# Patient Record
Sex: Female | Born: 1995 | Race: White | Hispanic: Yes | Marital: Single | Smoking: Former smoker
Health system: Southern US, Community
[De-identification: ages and names within clinical notes are randomized; demographics above are authoritative.]

## PROBLEM LIST (undated history)

## (undated) DIAGNOSIS — F419 Anxiety disorder, unspecified: Secondary | ICD-10-CM

## (undated) DIAGNOSIS — Z8041 Family history of malignant neoplasm of ovary: Secondary | ICD-10-CM

## (undated) DIAGNOSIS — Z8669 Personal history of other diseases of the nervous system and sense organs: Secondary | ICD-10-CM

## (undated) DIAGNOSIS — F909 Attention-deficit hyperactivity disorder, unspecified type: Secondary | ICD-10-CM

## (undated) DIAGNOSIS — M940 Chondrocostal junction syndrome [Tietze]: Secondary | ICD-10-CM

## (undated) HISTORY — DX: Chondrocostal junction syndrome (tietze): M94.0

## (undated) HISTORY — PX: MYRINGOTOMY: SUR874

## (undated) HISTORY — DX: Personal history of other diseases of the nervous system and sense organs: Z86.69

## (undated) HISTORY — DX: Family history of malignant neoplasm of ovary: Z80.41

## (undated) HISTORY — PX: WISDOM TOOTH EXTRACTION: SHX21

## (undated) HISTORY — DX: Attention-deficit hyperactivity disorder, unspecified type: F90.9

## (undated) HISTORY — DX: Anxiety disorder, unspecified: F41.9

---
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
## 2017-03-23 ENCOUNTER — Ambulatory Visit
Admission: EM | Admit: 2017-03-23 | Discharge: 2017-03-23 | Disposition: A | Discharge status: Home / Self Care | Payer: 59 | Attending: Family Medicine | Admitting: Family Medicine

## 2017-03-23 ENCOUNTER — Encounter: Payer: Self-pay | Admitting: Emergency Medicine

## 2017-03-23 ENCOUNTER — Other Ambulatory Visit: Payer: Self-pay

## 2017-03-23 DIAGNOSIS — F410 Panic disorder [episodic paroxysmal anxiety] without agoraphobia: Secondary | ICD-10-CM

## 2017-03-23 DIAGNOSIS — F419 Anxiety disorder, unspecified: Secondary | ICD-10-CM

## 2017-03-23 MED ORDER — HYDROXYZINE HCL 25 MG PO TABS: 25.0000 mg | ORAL_TABLET | Freq: Four times a day (QID) | ORAL | 0 refills | Status: DC

## 2017-03-23 MED ORDER — ALPRAZOLAM 0.25 MG PO TABS: 0.2500 mg | ORAL_TABLET | Freq: Three times a day (TID) | ORAL | 0 refills | Status: DC | PRN

## 2017-03-23 NOTE — ED Provider Notes (Addendum)
[facility]    CSN: [id number] Arrival date & time: 03/23/17  1845     History   Chief Complaint Chief Complaint  Patient presents with  . Dizziness  . Anxiety    HPI Rachel Hebert is a 22 y.o. female.   HPI  22 year old female presents with complaints of dizziness anxiety under extreme stress both at home and financially.  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  States that she was fine at work and at home had a feeling of eyes twitching wildly and when she closed her eyes would have colors of red and green.  She also has numbness in her right dominant arm and hand maintains a full function and use.  She appears upset.  She does not have ideation of self-harm or homicide.  She does state that she has already made an appointment to see a counselor at [facility] but is not for a week.  Had no chest pain.  She has had no visual changes.  Most of the numbness that she feels is in her hand on the right but not on the left and has no feelings  of radiation at this time.  She denies any syncope or near syncope.  States she has never had a similar attack in the past.  Lives with her sister and  as well as her sister's fianc, a roommate and herself.  She has been trying to save money to move out of the house on her own but has not been able to gather enough finances to do it.  She is also had car trouble and she is applying for nursing school wanting to attend classes starting in May. These are the Stressors to her.       History reviewed. No pertinent past medical history.  There are no active problems to display for this patient.   Past Surgical History:  Procedure Laterality Date  . MYRINGOTOMY    . WISDOM TOOTH EXTRACTION      OB History    No data available       Home Medications    Prior to Admission medications   Medication Sig Start Date End Date Taking? Authorizing Provider  ALPRAZolam (XANAX) 0.25 MG tablet Take 1 tablet (0.25 mg  total) by mouth 3 (three) times daily as needed for anxiety. 03/23/17   Lutricia Feil, PA-C  hydrOXYzine (ATARAX/VISTARIL) 25 MG tablet Take 1 tablet (25 mg total) by mouth every 6 (six) hours. 03/23/17   Lutricia Feil, PA-C    Family History History reviewed. No pertinent family history.  Social History Social History   Tobacco Use  . Smoking status: Never Smoker  . Smokeless tobacco: Never Used  Substance Use Topics  . Alcohol use: No  . Drug use: No     Allergies   Patient has no known allergies.   Review of Systems Review of Systems  Constitutional: Positive for activity change. Negative for appetite change, chills, diaphoresis, fatigue and fever.  Neurological: Positive for dizziness.  Psychiatric/Behavioral: Negative for self-injury, sleep disturbance and suicidal ideas. The patient is nervous/anxious.   All other systems reviewed and are negative.    Physical Exam Triage Vital Signs ED Triage Vitals  Enc Vitals Group     BP 03/23/17 1858 125/84     Pulse Rate 03/23/17 1858 74     Resp 03/23/17 1858 14     Temp 03/23/17 1858 98.4 F (36.9 C)  Temp Source 03/23/17 1858 Oral     SpO2 03/23/17 1858 99 %     Weight 03/23/17 1855 127 lb (57.6 kg)     Height 03/23/17 1855 4\' 11"  (1.499 m)     Head Circumference --      Peak Flow --      Pain Score 03/23/17 1855 0     Pain Loc --      Pain Edu? --      Excl. in GC? --    No data found.  Updated Vital Signs BP 125/84 (BP Location: Left Arm)   Pulse 74   Temp 98.4 F (36.9 C) (Oral)   Resp 14   Ht 4\' 11"  (1.499 m)   Wt 127 lb (57.6 kg)   LMP 03/14/2017 (Approximate)   SpO2 99%   BMI 25.65 kg/m   Visual Acuity Right Eye Distance:   Left Eye Distance:   Bilateral Distance:    Right Eye Near:   Left Eye Near:    Bilateral Near:     Physical Exam  Constitutional: She is oriented to person, place, and time. She appears well-developed and well-nourished. No distress.  HENT:  Head:  Normocephalic.  Eyes: Pupils are equal, round, and reactive to light.  Neck: Normal range of motion.  Musculoskeletal: Normal range of motion.  Neurological: She is alert and oriented to person, place, and time.  Skin: Skin is warm and dry. She is not diaphoretic.  Psychiatric: She has a normal mood and affect. Her behavior is normal. Judgment and thought content normal.  Nursing note and vitals reviewed.    UC Treatments / Results  Labs (all labs ordered are listed, but only abnormal results are displayed) Labs Reviewed - No data to display  EKG  EKG Interpretation None       Radiology No results found.  Procedures Procedures (including critical care time)  Medications Ordered in UC Medications - No data to display   Initial Impression / Assessment and Plan / UC Course  I have reviewed the triage vital signs and the nursing notes.  Pertinent labs & imaging results that were available during my care of the patient were reviewed by me and considered in my medical decision making (see chart for details).     Plan: 1. Test/x-ray results and diagnosis reviewed with patient 2. rx as per orders; risks, benefits, potential side effects reviewed with patient 3. Recommend supportive treatment with to remove her appointment with a counselor up sooner.  In the meantime I have reassured her that there does not appear to be any physical problem with her that this is likely a panic attack.  We will prescribe a few medications to help with her anxiety hopes that she will be able to see the counselor sooner for intervention.  If she worsens or has any edges in her or any other physical signs I recommend she go to the emergency room where they have mental health professionals on staff. 4. F/u prn if symptoms worsen or don't improve   Final Clinical Impressions(s) / UC Diagnoses   Final diagnoses:  Panic attack  Anxiety    ED Discharge Orders        Ordered    ALPRAZolam (XANAX)  0.25 MG tablet  3 times daily PRN     03/23/17 1944    hydrOXYzine (ATARAX/VISTARIL) 25 MG tablet  Every 6 hours     03/23/17 1944       Controlled Substance Prescriptions [place]  Controlled Substance Registry consulted? Not Applicable   Lutricia Feil, PA-C 03/23/17 2001    Lutricia Feil, PA-C 03/23/17 2004

## 2017-03-23 NOTE — ED Triage Notes (Signed)
Patient states that while at home she started to feel dizzy about an hour ago and started to feel anxious.

## 2017-04-13 ENCOUNTER — Ambulatory Visit (INDEPENDENT_AMBULATORY_CARE_PROVIDER_SITE_OTHER): Payer: 59 | Admitting: Certified Nurse Midwife

## 2017-04-13 ENCOUNTER — Encounter: Payer: Self-pay | Admitting: Certified Nurse Midwife

## 2017-04-13 VITALS — BP 102/88 | HR 69 | Ht 59.0 in | Wt 131.0 lb

## 2017-04-13 DIAGNOSIS — Z131 Encounter for screening for diabetes mellitus: Secondary | ICD-10-CM

## 2017-04-13 DIAGNOSIS — Z01419 Encounter for gynecological examination (general) (routine) without abnormal findings: Secondary | ICD-10-CM | POA: Diagnosis not present

## 2017-04-13 DIAGNOSIS — Z13220 Encounter for screening for lipoid disorders: Secondary | ICD-10-CM

## 2017-04-13 DIAGNOSIS — Z113 Encounter for screening for infections with a predominantly sexual mode of transmission: Secondary | ICD-10-CM | POA: Diagnosis not present

## 2017-04-13 DIAGNOSIS — F419 Anxiety disorder, unspecified: Secondary | ICD-10-CM | POA: Insufficient documentation

## 2017-04-13 DIAGNOSIS — F410 Panic disorder [episodic paroxysmal anxiety] without agoraphobia: Secondary | ICD-10-CM | POA: Insufficient documentation

## 2017-04-13 NOTE — Progress Notes (Signed)
Gynecology Annual Exam  PCP: Patient, No Pcp Per  Chief Complaint:  Chief Complaint  Patient presents with  . Gynecologic Exam    History of Present Illness: Rachel Hebert is a 22 y.o. female who presents for a NP gyn exam. She has recently moved from [facility], [place] And works at same day surgery. The patient has no complaints today.  Her menses are regular, they occur every month, and they last 4 days. Her flow is moderate. She does not have intermenstrual bleeding. Her last menstrual period was 03/21/2017. She has dysmenorrhea and uses a heating pad with relief. Last pap smear: NA due to age.   The patient is not currently sexually active. She currently uses condoms for contraception.   Her past medical history is remarkable for anxiety and common migraines.  The patient does not know how to perform self breast exams. Her last mammogram was NA.   There is no family history of breast cancer.    There is no family history of ovarian cancer.  The patient denies smoking.  She denies drinking.alcohol   She denies illegal drug use.  The patient reports exercising 1-3 times/week.  The patient denies current symptoms of depression.    Review of Systems: Review of Systems  Constitutional: Negative for chills, fever and weight loss.  HENT: Negative for congestion, sinus pain and sore throat.   Eyes: Positive for blurred vision (far vision is blurred). Negative for pain.  Respiratory: Negative for hemoptysis, shortness of breath and wheezing.   Cardiovascular: Negative for chest pain, palpitations and leg swelling.  Gastrointestinal: Positive for constipation (intermittent), diarrhea (1-2 days ago) and nausea (with panic attacks). Negative for abdominal pain, blood in stool, heartburn and vomiting.  Genitourinary: Negative for dysuria, frequency, hematuria and urgency.       Positive for vaginal discharge. Negative for vaginal odor or itching    Musculoskeletal: Negative for back pain, joint pain and myalgias.  Skin: Negative for itching and rash.  Neurological: Positive for dizziness (with panic attack), tingling (in right arm with most recent panic attack) and headaches.  Endo/Heme/Allergies: Negative for environmental allergies and polydipsia. Does not bruise/bleed easily.       Negative for hirsutism   Psychiatric/Behavioral: Negative for depression. The patient is nervous/anxious. The patient does not have insomnia.     Past Medical History:  Past Medical History:  Diagnosis Date  . Anxiety     Past Surgical History:  Past Surgical History:  Procedure Laterality Date  . MYRINGOTOMY    . WISDOM TOOTH EXTRACTION      Family History:  History reviewed. No pertinent family history.  Social History:  Social History   Socioeconomic History  . Marital status: Single    Spouse name: Not on file  . Number of children: 0  . Years of education: Not on file  . Highest education level: Not on file  Social Needs  . Financial resource strain: Not on file  . Food insecurity - worry: Not on file  . Food insecurity - inability: Not on file  . Transportation needs - medical: Not on file  . Transportation needs - non-medical: Not on file  Occupational History  . 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 Smoking status: Never Smoker  . Smokeless tobacco: Never Used  Substance and Sexual Activity  . Alcohol use: No  . Drug use: No  . Sexual activity: Not Currently    Birth control/protection: Condom  Other Topics Concern  . Not on file  Social History Narrative  . Not on file    Allergies:  No Known Allergies  Medications:  Current Outpatient Medications:  Marland Kitchen.  Multiple Vitamins-Minerals (HAIR SKIN AND NAILS FORMULA PO), Take 1 tablet by mouth daily., Disp: , Rfl:  .  Multiple Vitamins-Minerals (MULTIVITAMIN WITH MINERALS) tablet, Take 1 tablet by mouth daily., Disp: , Rfl:  .  ALPRAZolam (XANAX) 0.25 MG  tablet, Take 1 tablet (0.25 mg total) by mouth 3 (three) times daily as needed for anxiety. (Patient not taking: Reported on 04/13/2017), Disp: 6 tablet, Rfl: 0 .  hydrOXYzine (ATARAX/VISTARIL) 25 MG tablet, Take 1 tablet (25 mg total) by mouth every 6 (six) hours. (Patient not taking: Reported on 04/13/2017), Disp: 25 tablet, Rfl: 0  Physical Exam Vitals: BP 102/88   Pulse 69   Ht 4\' 11"  (1.499 m)   Wt 131 lb (59.4 kg)   LMP 03/21/2017   BMI 26.46 kg/m    General: teen in  NAD HEENT: normocephalic, anicteric Neck: no thyroid enlargement, no palpable nodules, no cervical lymphadenopathy  Pulmonary: No increased work of breathing, CTAB Cardiovascular: RRR, without murmur  Breast: Breast symmetrical, no tenderness, no palpable nodules or masses, no skin or nipple retraction present, no nipple discharge.  No axillary, infraclavicular or supraclavicular lymphadenopathy. Abdomen: Soft, non-tender, non-distended.  Umbilicus pierced.  No hepatomegaly or masses palpable. No evidence of hernia. Genitourinary:  External: Normal external female genitalia.  Normal urethral meatus, normal Bartholin's and Skene's glands.    Vagina: Normal vaginal mucosa, no evidence of prolapse.    Cervix: no bleeding, non-tender  Uterus: Anteverted, small, mobile, and non-tender  Adnexa: No adnexal masses, non-tender  Rectal: deferred  Lymphatic: no evidence of inguinal lymphadenopathy Extremities: no edema, erythema, or tenderness Neurologic: Grossly intact Psychiatric: mood appropriate, affect full     Assessment: 22 y.o. NP annual exam   Plan:   1) Breast cancer screening - recommend monthly self breast exam. Taught self breast exam  2) STI screening was offered and accepted. GC/Chlamydia/Trich NAAT, RPR, HIV  3) Cervical cancer screening - start Pap smears at age 20  4) Contraception - Education given regarding options for contraception, particularly OCPS and the implant  5) Routine healthcare  maintenance including cholesterol and diabetes screening ordered today. [facility].  Farrel Connersolleen Linetta Regner, CNM

## 2017-04-14 DIAGNOSIS — Z13220 Encounter for screening for lipoid disorders: Secondary | ICD-10-CM | POA: Diagnosis not present

## 2017-04-14 DIAGNOSIS — Z113 Encounter for screening for infections with a predominantly sexual mode of transmission: Secondary | ICD-10-CM | POA: Diagnosis not present

## 2017-04-14 DIAGNOSIS — Z131 Encounter for screening for diabetes mellitus: Secondary | ICD-10-CM | POA: Diagnosis not present

## 2017-04-15 LAB — RPR QUALITATIVE: RPR: NONREACTIVE

## 2017-04-15 LAB — LIPID PANEL WITH LDL/HDL RATIO
Cholesterol, Total: 158 mg/dL (ref 100–169)
HDL: 50 mg/dL (ref >39)
LDL CALC: 93 mg/dL (ref 0–109)
LDl/HDL Ratio: 1.9 ratio (ref 0.0–3.2)
Triglycerides: 74 mg/dL (ref 0–89)
VLDL Cholesterol Cal: 15 mg/dL (ref 5–40)

## 2017-04-15 LAB — HGB A1C W/O EAG: Hgb A1c MFr Bld: 5.1 % (ref 4.8–5.6)

## 2017-04-15 LAB — HIV ANTIBODY (ROUTINE TESTING W REFLEX): HIV SCREEN 4TH GENERATION: NONREACTIVE

## 2017-04-16 ENCOUNTER — Encounter: Payer: Self-pay | Admitting: Certified Nurse Midwife

## 2017-04-16 LAB — CHLAMYDIA/GONOCOCCUS/TRICHOMONAS, NAA
CHLAMYDIA BY NAA: NEGATIVE
GONOCOCCUS BY NAA: NEGATIVE
Trich vag by NAA: NEGATIVE

## 2017-04-22 ENCOUNTER — Emergency Department: Payer: 59

## 2017-04-22 ENCOUNTER — Encounter: Payer: Self-pay | Admitting: Emergency Medicine

## 2017-04-22 ENCOUNTER — Other Ambulatory Visit: Payer: Self-pay

## 2017-04-22 DIAGNOSIS — R0789 Other chest pain: Secondary | ICD-10-CM | POA: Insufficient documentation

## 2017-04-22 DIAGNOSIS — R079 Chest pain, unspecified: Secondary | ICD-10-CM | POA: Diagnosis not present

## 2017-04-22 LAB — URINALYSIS, COMPLETE (UACMP) WITH MICROSCOPIC
BILIRUBIN URINE: NEGATIVE
Glucose, UA: NEGATIVE mg/dL
Hgb urine dipstick: NEGATIVE
KETONES UR: 5 mg/dL — AB
LEUKOCYTES UA: NEGATIVE
NITRITE: NEGATIVE
PROTEIN: NEGATIVE mg/dL
Specific Gravity, Urine: 1.024 (ref 1.005–1.030)
pH: 5 (ref 5.0–8.0)

## 2017-04-22 LAB — BASIC METABOLIC PANEL
Anion gap: 5 (ref 5–15)
BUN: 7 mg/dL (ref 6–20)
CHLORIDE: 108 mmol/L (ref 101–111)
CO2: 25 mmol/L (ref 22–32)
Calcium: 9.5 mg/dL (ref 8.9–10.3)
Creatinine, Ser: 0.68 mg/dL (ref 0.44–1.00)
GFR calc Af Amer: >60 mL/min (ref >60)
GFR calc non Af Amer: >60 mL/min (ref >60)
Glucose, Bld: 109 mg/dL — ABNORMAL HIGH (ref 65–99)
POTASSIUM: 3.9 mmol/L (ref 3.5–5.1)
Sodium: 138 mmol/L (ref 135–145)

## 2017-04-22 LAB — POCT PREGNANCY, URINE: Preg Test, Ur: NEGATIVE

## 2017-04-22 LAB — CBC
HEMATOCRIT: 39.9 % (ref 35.0–47.0)
Hemoglobin: 13.5 g/dL (ref 12.0–16.0)
MCH: 28.9 pg (ref 26.0–34.0)
MCHC: 33.8 g/dL (ref 32.0–36.0)
MCV: 85.6 fL (ref 80.0–100.0)
Platelets: 380 10*3/uL (ref 150–440)
RBC: 4.66 MIL/uL (ref 3.80–5.20)
RDW: 13.5 % (ref 11.5–14.5)
WBC: 10.8 10*3/uL (ref 3.6–11.0)

## 2017-04-22 LAB — TROPONIN I: Troponin I: <0.03 ng/mL (ref <0.03)

## 2017-04-22 NOTE — ED Triage Notes (Signed)
Pt arrives ambulatory to triage with sub sternal chest pain which has been going on and off since Wednesday. Pt is in NAD.

## 2017-04-23 ENCOUNTER — Emergency Department
Admission: EM | Admit: 2017-04-23 | Discharge: 2017-04-23 | Disposition: A | Discharge status: Home / Self Care | Payer: 59 | Attending: Emergency Medicine | Admitting: Emergency Medicine

## 2017-04-23 DIAGNOSIS — R0789 Other chest pain: Secondary | ICD-10-CM

## 2017-04-23 NOTE — ED Provider Notes (Signed)
[facility] Emergency Department Provider Note  ____________________________________________   First MD Initiated Contact with Patient 04/23/17 [phone]     (approximate)  I have reviewed the triage vital signs and the nursing notes.   HISTORY  Chief Complaint Chest Pain    HPI Rachel Hebert is a 22 y.o. female who self presents to the emergency department with several days of intermittent atypical chest pain.  Her chest pain is stabbing in her left lateral chest.  It is intermittent.  It is nonradiating.  Nothing in particular seems to make it better or worse.  She has no history of deep vein thromboses or pulmonary embolism.  No recent surgery travel or immobilization.  No leg swelling.  No history of sudden cardiac death.  No recent illness.  Her symptoms are nonexertional.   Past Medical History:  Diagnosis Date  . Anxiety   . Hx of migraine headaches    common migraines    Patient Active Problem List   Diagnosis Date Noted  . Anxiety 04/13/2017  . Panic attack 04/13/2017    Past Surgical History:  Procedure Laterality Date  . MYRINGOTOMY    . WISDOM TOOTH EXTRACTION      Prior to Admission medications   Medication Sig Start Date End Date Taking? Authorizing Provider  ALPRAZolam (XANAX) 0.25 MG tablet Take 1 tablet (0.25 mg total) by mouth 3 (three) times daily as needed for anxiety. Patient not taking: Reported on 04/13/2017 03/23/17   Lutricia Feiloemer, William P, PA-C  hydrOXYzine (ATARAX/VISTARIL) 25 MG tablet Take 1 tablet (25 mg total) by mouth every 6 (six) hours. Patient not taking: Reported on 04/13/2017 03/23/17   Lutricia Feiloemer, William P, PA-C  Multiple Vitamins-Minerals (HAIR SKIN AND NAILS FORMULA PO) Take 1 tablet by mouth daily.    [provider]  Multiple Vitamins-Minerals (MULTIVITAMIN WITH MINERALS) tablet Take 1 tablet by mouth daily.    [provider]    Allergies Patient has no known allergies.  No family history on  file.  Social History Social History   Tobacco Use  . Smoking status: Never Smoker  . Smokeless tobacco: Never Used  Substance Use Topics  . Alcohol use: No  . Drug use: No    Review of Systems Constitutional: No fever/chills Eyes: No visual changes. ENT: No sore throat. Cardiovascular: Positive for chest pain. Respiratory: Denies shortness of breath. Gastrointestinal: No abdominal pain.  No nausea, no vomiting.  No diarrhea.  No constipation. Genitourinary: Negative for dysuria. Musculoskeletal: Negative for back pain. Skin: Negative for rash. Neurological: Negative for headaches, focal weakness or numbness.   ____________________________________________   PHYSICAL EXAM:  VITAL SIGNS: ED Triage Vitals [04/22/17 1914]  Enc Vitals Group     BP 123/76     Pulse Rate 78     Resp 18     Temp 98.2 F (36.8 C)     Temp Source Oral     SpO2 97 %     Weight 130 lb (59 kg)     Height 4\' 11"  (1.499 m)     Head Circumference      Peak Flow      Pain Score 3     Pain Loc      Pain Edu?      Excl. in GC?     Constitutional: Alert and oriented x4 mildly anxious appearing nontoxic no diaphoresis speaks in full clear sentences Eyes: PERRL EOMI. Head: Atraumatic. Nose: No congestion/rhinnorhea. Mouth/Throat: No trismus Neck: No  stridor.   Cardiovascular: Normal rate, regular rhythm. Grossly normal heart sounds.  Good peripheral circulation. Respiratory: Normal respiratory effort.  No retractions. Lungs CTAB and moving good air Gastrointestinal: Soft nontender Musculoskeletal: No lower extremity edema legs are equal in size Neurologic:  Normal speech and language. No gross focal neurologic deficits are appreciated. Skin:  Skin is warm, dry and intact. No rash noted. Psychiatric: Mood and affect are normal. Speech and behavior are normal.    ____________________________________________   DIFFERENTIAL includes but not limited to  Pericarditis, myocarditis, acute  coronary syndrome, pulmonary embolism, pneumothorax ____________________________________________   LABS (all labs ordered are listed, but only abnormal results are displayed)  Labs Reviewed  BASIC METABOLIC PANEL - Abnormal; Notable for the following components:      Result Value   Glucose, Bld 109 (*)    All other components within normal limits  URINALYSIS, COMPLETE (UACMP) WITH MICROSCOPIC - Abnormal; Notable for the following components:   Color, Urine YELLOW (*)    APPearance HAZY (*)    Ketones, ur 5 (*)    Bacteria, UA RARE (*)    Squamous Epithelial / LPF 0-5 (*)    All other components within normal limits  CBC  TROPONIN I  POC URINE PREG, ED  POCT PREGNANCY, URINE    Lab work reviewed by me with no acute disease __________________________________________  EKG  ED ECG REPORT I, Merrily Brittle, the attending physician, personally viewed and interpreted this ECG.  Date: 04/23/2017 EKG Time:  Rate: 72 Rhythm: normal sinus rhythm QRS Axis: normal Intervals: normal ST/T Wave abnormalities: normal Narrative Interpretation: no evidence of acute ischemia  ____________________________________________  RADIOLOGY  Chest x-ray reviewed by me with no acute disease ____________________________________________   PROCEDURES  Procedure(s) performed: no  Procedures  Critical Care performed: no  Observation: no ____________________________________________   INITIAL IMPRESSION / ASSESSMENT AND PLAN / ED COURSE  Pertinent labs & imaging results that were available during my care of the patient were reviewed by me and considered in my medical decision making (see chart for details).  The patient arrives somewhat anxious appearing with atypical chest pain.  EKG is reassuring without rightward axis.  Chest x-ray shows no evidence of pneumothorax and no evidence of acute disease.  Her symptoms are not consistent with acute coronary syndrome.  Do not suspect  pericarditis or myocarditis.  She is PERC negative.  I had a lengthy discussion with patient regarding diagnostic uncertainty and the importance of primary care follow-up.  Strict return precautions have been given and the patient verbalizes understanding and agreement with the plan.     ____________________________________________   FINAL CLINICAL IMPRESSION(S) / ED DIAGNOSES  Final diagnoses:  Atypical chest pain      NEW MEDICATIONS STARTED DURING THIS VISIT:  Discharge Medication List as of 04/23/2017  2:02 AM       Note:  This document was prepared using Dragon voice recognition software and may include unintentional dictation errors.     Merrily Brittle, MD 04/25/17 [phone]

## 2017-04-23 NOTE — Discharge Instructions (Signed)
Fortunately today your chest x-ray, your EKG, and your blood work were reassuring.  Please make sure you remain well-hydrated and follow-up with primary care this coming week for reevaluation.  Return to the emergency department sooner for any concerns.  It was a pleasure to take care of you today, and thank you for coming to our emergency department.  If you have any questions or concerns before leaving please ask the nurse to grab me and I'm more than happy to go through your aftercare instructions again.  If you were prescribed any opioid pain medication today such as Norco, Vicodin, Percocet, morphine, hydrocodone, or oxycodone please make sure you do not drive when you are taking this medication as it can alter your ability to drive safely.  If you have any concerns once you are home that you are not improving or are in fact getting worse before you can make it to your follow-up appointment, please do not hesitate to call 911 and come back for further evaluation.  Merrily Brittle, MD  Results for orders placed or performed during the hospital encounter of 04/23/17  Basic metabolic panel  Result Value Ref Range   Sodium 138 135 - 145 mmol/L   Potassium 3.9 3.5 - 5.1 mmol/L   Chloride 108 101 - 111 mmol/L   CO2 25 22 - 32 mmol/L   Glucose, Bld 109 (H) 65 - 99 mg/dL   BUN 7 6 - 20 mg/dL   Creatinine, Ser 5.40 0.44 - 1.00 mg/dL   Calcium 9.5 8.9 - 98.1 mg/dL   GFR calc non Af Amer >60 >60 mL/min   GFR calc Af Amer >60 >60 mL/min   Anion gap 5 5 - 15  CBC  Result Value Ref Range   WBC 10.8 3.6 - 11.0 K/uL   RBC 4.66 3.80 - 5.20 MIL/uL   Hemoglobin 13.5 12.0 - 16.0 g/dL   HCT 19.1 47.8 - 29.5 %   MCV 85.6 80.0 - 100.0 fL   MCH 28.9 26.0 - 34.0 pg   MCHC 33.8 32.0 - 36.0 g/dL   RDW 62.1 30.8 - 65.7 %   Platelets 380 150 - 440 K/uL  Troponin I  Result Value Ref Range   Troponin I <0.03 <0.03 ng/mL  Urinalysis, Complete w Microscopic  Result Value Ref Range   Color, Urine YELLOW  (A) YELLOW   APPearance HAZY (A) CLEAR   Specific Gravity, Urine 1.024 1.005 - 1.030   pH 5.0 5.0 - 8.0   Glucose, UA NEGATIVE NEGATIVE mg/dL   Hgb urine dipstick NEGATIVE NEGATIVE   Bilirubin Urine NEGATIVE NEGATIVE   Ketones, ur 5 (A) NEGATIVE mg/dL   Protein, ur NEGATIVE NEGATIVE mg/dL   Nitrite NEGATIVE NEGATIVE   Leukocytes, UA NEGATIVE NEGATIVE   RBC / HPF 0-5 0 - 5 RBC/hpf   WBC, UA 0-5 0 - 5 WBC/hpf   Bacteria, UA RARE (A) NONE SEEN   Squamous Epithelial / LPF 0-5 (A) NONE SEEN   Mucus PRESENT   Pregnancy, urine POC  Result Value Ref Range   Preg Test, Ur NEGATIVE NEGATIVE   Dg Chest 2 View  Result Date: 04/22/2017 CLINICAL DATA:  22 year old female with history of substernal chest pain intermittently for the past 4 days. EXAM: CHEST - 2 VIEW COMPARISON:  No priors. FINDINGS: Lung volumes are normal. No consolidative airspace disease. No pleural effusions. No pneumothorax. No pulmonary nodule or mass noted. Pulmonary vasculature and the cardiomediastinal silhouette are within normal limits. IMPRESSION: No radiographic  evidence of acute cardiopulmonary disease. Electronically Signed   By: Trudie Reedaniel  Entrikin M.D.   On: 04/22/2017 20:00

## 2017-04-23 NOTE — ED Notes (Signed)
ED Provider at bedside. 

## 2017-04-25 ENCOUNTER — Encounter (INDEPENDENT_AMBULATORY_CARE_PROVIDER_SITE_OTHER): Payer: Self-pay

## 2017-05-01 DIAGNOSIS — F411 Generalized anxiety disorder: Secondary | ICD-10-CM | POA: Diagnosis not present

## 2017-05-01 DIAGNOSIS — R0789 Other chest pain: Secondary | ICD-10-CM | POA: Diagnosis not present

## 2017-05-01 DIAGNOSIS — Z7689 Persons encountering health services in other specified circumstances: Secondary | ICD-10-CM | POA: Diagnosis not present

## 2017-09-01 ENCOUNTER — Other Ambulatory Visit: Payer: Self-pay | Admitting: Internal Medicine

## 2017-09-01 DIAGNOSIS — R635 Abnormal weight gain: Secondary | ICD-10-CM | POA: Diagnosis not present

## 2017-09-01 DIAGNOSIS — F411 Generalized anxiety disorder: Secondary | ICD-10-CM | POA: Diagnosis not present

## 2017-09-01 DIAGNOSIS — R1011 Right upper quadrant pain: Secondary | ICD-10-CM | POA: Diagnosis not present

## 2017-09-06 ENCOUNTER — Ambulatory Visit
Admission: RE | Admit: 2017-09-06 | Discharge: 2017-09-06 | Disposition: A | Discharge status: Home / Self Care | Payer: 59 | Source: Ambulatory Visit | Attending: Internal Medicine | Admitting: Internal Medicine

## 2017-09-06 DIAGNOSIS — R1011 Right upper quadrant pain: Secondary | ICD-10-CM

## 2017-09-28 DIAGNOSIS — H5213 Myopia, bilateral: Secondary | ICD-10-CM | POA: Diagnosis not present

## 2017-10-03 DIAGNOSIS — R197 Diarrhea, unspecified: Secondary | ICD-10-CM | POA: Diagnosis not present

## 2017-10-03 DIAGNOSIS — R1011 Right upper quadrant pain: Secondary | ICD-10-CM | POA: Diagnosis not present

## 2017-12-15 DIAGNOSIS — M940 Chondrocostal junction syndrome [Tietze]: Secondary | ICD-10-CM | POA: Diagnosis not present

## 2017-12-15 DIAGNOSIS — F3342 Major depressive disorder, recurrent, in full remission: Secondary | ICD-10-CM | POA: Diagnosis not present

## 2017-12-15 DIAGNOSIS — F411 Generalized anxiety disorder: Secondary | ICD-10-CM | POA: Diagnosis not present

## 2017-12-21 ENCOUNTER — Encounter: Payer: Self-pay | Admitting: Physician Assistant

## 2017-12-21 ENCOUNTER — Ambulatory Visit: Payer: Self-pay | Admitting: Physician Assistant

## 2017-12-21 VITALS — BP 136/90 | HR 92 | Temp 98.7°F | Wt 143.0 lb

## 2017-12-21 DIAGNOSIS — R0981 Nasal congestion: Secondary | ICD-10-CM

## 2017-12-21 DIAGNOSIS — J029 Acute pharyngitis, unspecified: Secondary | ICD-10-CM

## 2017-12-21 DIAGNOSIS — J0111 Acute recurrent frontal sinusitis: Secondary | ICD-10-CM

## 2017-12-21 LAB — POCT RAPID STREP A (OFFICE): RAPID STREP A SCREEN: NEGATIVE

## 2017-12-21 MED ORDER — CEFDINIR 300 MG PO CAPS: 300.0000 mg | ORAL_CAPSULE | Freq: Two times a day (BID) | ORAL | 0 refills | Status: AC

## 2017-12-21 NOTE — Patient Instructions (Signed)
Thank you for choosing [facility] for your health care needs.  You have been diagnosed with a sore throat and an upper respiratory infection.  Your rapid strep test was NEGATIVE.  You have been prescribed an antibiotic, Cefdinir, due to your double illness.  Recommend you increase fluids. Rest. Use Tylenol or ibuprofen for fever/headache. Gargle with salt water. Use analgesic throat spray or throat lozenges such as Cepacol or Chloraseptic for sore throat.  Return to clinic in 4-5 days if symptoms not improving.  Hope you feel better soon.  Sore Throat When you have a sore throat, your throat may:  Hurt.  Burn.  Feel irritated.  Feel scratchy.  Many things can cause a sore throat, including:  An infection.  Allergies.  Dryness in the air.  Smoke or pollution.  Gastroesophageal reflux disease (GERD).  A tumor.  A sore throat can be the first sign of another sickness. It can happen with other problems, like coughing or a fever. Most sore throats go away without treatment. Follow these instructions at home:  Take over-the-counter medicines only as told by your doctor.  Drink enough fluids to keep your pee (urine) clear or pale yellow.  Rest when you feel you need to.  To help with pain, try: ? Sipping warm liquids, such as broth, herbal tea, or warm water. ? Eating or drinking cold or frozen liquids, such as frozen ice pops. ? Gargling with a salt-water mixture 3-4 times a day or as needed. To make a salt-water mixture, add -1 tsp of salt in 1 cup of warm water. Mix it until you cannot see the salt anymore. ? Sucking on hard candy or throat lozenges. ? Putting a cool-mist humidifier in your bedroom at night. ? Sitting in the bathroom with the door closed for 5-10 minutes while you run hot water in the shower.  Do not use any tobacco products, such as cigarettes, chewing tobacco, and e-cigarettes. If you need help quitting, ask your doctor. Contact a doctor  if:  You have a fever for more than 2-3 days.  You keep having symptoms for more than 2-3 days.  Your throat does not get better in 7 days.  You have a fever and your symptoms suddenly get worse. Get help right away if:  You have trouble breathing.  You cannot swallow fluids, soft foods, or your saliva.  You have swelling in your throat or neck that gets worse.  You keep feeling like you are going to throw up (vomit).  You keep throwing up. This information is not intended to replace advice given to you by your health care provider. Make sure you discuss any questions you have with your health care provider. Document Released: 11/03/2007 Document Revised: 09/20/2015 Document Reviewed: 11/14/2014 Elsevier Interactive Patient Education  [employer]2018 [employer]

## 2017-12-21 NOTE — Progress Notes (Signed)
Patient ID: Rachel Hebert DOB: 04-30-1995 AGE: 22 y.o. MRN: [id number]   PCP: Barbette Reichmann, MD   Chief Complaint:  Chief Complaint  Patient presents with  . save-sorethroat/sinus pressure     Subjective:    HPI:  Rachel Hebert is a 22 y.o. female presents for evaluation  Chief Complaint  Patient presents with  . save-sorethroat/sinus pressure    22 year old female presents with four day history of sore throat. Began as mild. Now severe. Aggravated with swallowing. Associated swollen sensation. Associated sweats/warmth, headache, nasal congestion, frontal sinus pressure/congestion, and postnasal drip. Occasional cough. Patient feels symptoms are worsening. Had similar symptoms two weeks ago, lasted one week, then resolved for a few days until current illness. Denies fever, chills, ear pain, chest pain, SOB, wheezing. Patient did receive this season's influenza vaccination.  Patient seen by Barbette Reichmann MD with [facility] on 12/15/2017. Follow-up for costochondritis. Currently on Meloxicam 7.5mg  qd and Prilosec 20mg  (diagnosis of epigastric pain). Patient states she last took Meloxicam on Monday 12/18/2017; denies current epigastric pain, eructation, abdominal fullness/early satiety, abdominal bloating.  A complete, at least 10 system review of symptoms was performed, pertinent positives and negatives as mentioned in HPI, otherwise negative.  The following portions of the patient's history were reviewed and updated as appropriate: allergies, current medications and past medical history.  Patient Active Problem List   Diagnosis Date Noted  . Anxiety 04/13/2017  . Panic attack 04/13/2017    No Known Allergies  Current Outpatient Medications on File Prior to Visit  Medication Sig Dispense Refill  . buPROPion (WELLBUTRIN XL) 150 MG 24 hr tablet Take by mouth.    . meloxicam (MOBIC) 7.5 MG tablet Take by mouth.    Marland Kitchen omeprazole (PRILOSEC) 20 MG capsule  Take by mouth.     No current facility-administered medications on file prior to visit.        Objective:   Vitals:   12/21/17 1138  BP: 136/90  Pulse: 92  Temp: 98.7 F (37.1 C)  SpO2: 99%     Wt Readings from Last 3 Encounters:  12/21/17 143 lb (64.9 kg)  04/22/17 130 lb (59 kg)  04/13/17 131 lb (59.4 kg) (55 %, Z= 0.12)*   * Growth percentiles are based on CDC (Girls, 2-20 Years) data.    Physical Exam:   General Appearance:  Alert, cooperative, appears stated age. In no acute distress. Afebrile.  Head:  Normocephalic, without obvious abnormality, atraumatic  Eyes:  PERRL, conjunctiva/corneas clear, EOM's intact, fundi benign, both eyes  Ears:  Normal TM's and external ear canals, both ears  Nose: Nares normal, septum midline. Mucosa reveals bilateral edema, thin/clear rhinorrhea, nasally sounding voice. Tenderness with palpation at superior aspect of nose/medial aspect of eyebrows bilaterally.  Throat: Lips, mucosa, and tongue normal; teeth and gums normal. Throat reveals moderate erythema. Tonsils large bilaterally and diffusely erythematous. No visible exudate. Mild postnasal drip.  Neck: Supple, symmetrical, trachea midline, no adenopathy;  thyroid: not enlarged, symmetric, no tenderness/mass/nodules; no carotid bruit or JVD  Back:   Symmetric, no curvature, ROM normal, no CVA tenderness  Lungs:   Clear to auscultation bilaterally, respirations unlabored. Occasional dry cough during examination.  Heart:  Regular rate and rhythm, S1 and S2 normal, no murmur, rub, or gallop  Abdomen:   Soft, non-tender, bowel sounds active all four quadrants,  no masses, no organomegaly  Extremities: Extremities normal, atraumatic, no cyanosis or edema  Pulses: 2+ and symmetric  Skin: Skin color,  texture, turgor normal, no rashes or lesions  Lymph nodes: Cervical, supraclavicular, and axillary nodes normal  Neurologic: Normal    Assessment & Plan:    Exam findings, diagnosis  etiology and medication use and indications reviewed with patient. Follow-Up and discharge instructions provided. No emergent/urgent issues found on exam.  Patient education was provided.   Patient verbalized understanding of information provided and agrees with plan of care (POC), all questions answered. The patient is advised to call or return to clinic if condition does not see an improvement in symptoms, or to seek the care of the closest emergency department if condition worsens with the below plan.    1. Sore throat  - POCT rapid strep A NEGATIVE  2. Nasal congestion  3. Acute recurrent frontal sinusitis  - cefdinir (OMNICEF) 300 MG capsule; Take 1 capsule (300 mg total) by mouth 2 (two) times daily for 7 days.  Dispense: 14 capsule; Refill: 0  Discussed with patient concern for Meloxicam prescription (despite Prilosec prescription) due persistent NSAID use causing gastritis with possible symptom of sore throat. Patient rarely using Meloxicam. Denies other gastritis symptoms. Intends to discontinue use of Meloxicam.  Patient with four day history of URI symptoms; however, second recent illness and worsening. At this time, believe antibiotic is warranted. Prescribed Cefdinir. Advised continuation of symptomatic URI treatment. Gave patient two days off from work. Patient advised to follow-up with family physician in 4-5 days if symptoms not improving.   Janalyn HarderSamantha Lizvet Chunn, MHS, PA-C Rulon SeraSamantha F. Bell Cai, MHS, PA-C Advanced Practice Provider [facility]  [facility]  [address], [facility], 1st Floor [place], [place] [address] (p):  [phone] [email or web address]

## 2017-12-25 ENCOUNTER — Telehealth: Payer: Self-pay | Admitting: Emergency Medicine

## 2017-12-25 NOTE — Telephone Encounter (Signed)
Spoke with patient whom informed me that she is doing so much better. This was a follow up call from [facility] visit. 

## 2018-04-25 ENCOUNTER — Other Ambulatory Visit: Payer: Self-pay

## 2018-04-25 ENCOUNTER — Other Ambulatory Visit (HOSPITAL_COMMUNITY)
Admission: RE | Admit: 2018-04-25 | Discharge: 2018-04-25 | Disposition: A | Discharge status: Home / Self Care | Payer: 59 | Source: Ambulatory Visit | Attending: Certified Nurse Midwife | Admitting: Certified Nurse Midwife

## 2018-04-25 ENCOUNTER — Ambulatory Visit (INDEPENDENT_AMBULATORY_CARE_PROVIDER_SITE_OTHER): Payer: 59 | Admitting: Certified Nurse Midwife

## 2018-04-25 ENCOUNTER — Encounter: Payer: Self-pay | Admitting: Certified Nurse Midwife

## 2018-04-25 VITALS — BP 120/68 | HR 95 | Ht 59.0 in | Wt 142.0 lb

## 2018-04-25 DIAGNOSIS — Z124 Encounter for screening for malignant neoplasm of cervix: Secondary | ICD-10-CM | POA: Diagnosis not present

## 2018-04-25 DIAGNOSIS — Z01419 Encounter for gynecological examination (general) (routine) without abnormal findings: Secondary | ICD-10-CM

## 2018-04-25 DIAGNOSIS — F419 Anxiety disorder, unspecified: Secondary | ICD-10-CM

## 2018-04-25 DIAGNOSIS — Z30011 Encounter for initial prescription of contraceptive pills: Secondary | ICD-10-CM

## 2018-04-25 MED ORDER — BUPROPION HCL ER (XL) 300 MG PO TB24: 300.0000 mg | ORAL_TABLET | Freq: Every day | ORAL | 2 refills | Status: DC

## 2018-04-25 MED ORDER — NORETHIN ACE-ETH ESTRAD-FE 1-20 MG-MCG(24) PO CAPS: 1.0000 | ORAL_CAPSULE | Freq: Every day | ORAL | 3 refills | Status: DC

## 2018-04-25 NOTE — Progress Notes (Signed)
Gynecology Annual Exam  PCP: Barbette ReichmannHande, Vishwanath, MD  Chief Complaint:  Chief Complaint  Patient presents with  . Gynecologic Exam    [facility]    History of Present Illness: Rachel Hebert is a 23 y.o. female who presents for her annual gyn exam. 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  Since her last annual 04/13/17, she was started on Lexapro then switched to Wellbutrin for treatment of anxiety. She reports that the Wellbutrin has been more effective with decreasing her anxiety, but would like to try an increased dose. She is unsure when she can get back to see her PCP due to the COVID-19 restrictions and is requesting whether she could have that done through this appointment.  She is also interested in starting OCPs. Has never been on OCPs, but would be a good candidate to help with her dysmenorrhea and acne. Has no contraindications to OCPs.   Her menses are regular, they occur every month, and they last 2-3 days. Her flow is moderate. She does not have intermenstrual bleeding. Her last menstrual period was 03/27/2018. She has dysmenorrhea and uses a heating pad and more recently 800 mgm ibuprofen with relief.  Last pap smear: NA due to age.   The patient is not currently sexually active. She has used  condoms for contraception in the past.   Her past medical history is remarkable for anxiety, acne and common migraines.  The patient does not perform self breast exams. Her last mammogram was NA.   There is no family history of breast cancer. Mother had a recent breast biopsy and is now needing an excision of a abnormality.   There is a family history of ovarian cancer in her paternal grandmother. Genetic testing has not been done.  The patient denies smoking.  She does drink alcohol occasionally   She denies illegal drug use.  The patient reports exercising most days of the week.  The patient denies current symptoms of  depression.    Review of Systems: Review of Systems  Constitutional: Negative for chills, fever and weight loss.       Positive for weight gain  HENT: Negative for congestion, sinus pain and sore throat.   Eyes: Positive for blurred vision (far vision is blurred). Negative for pain.  Respiratory: Negative for hemoptysis, shortness of breath and wheezing.   Cardiovascular: Negative for chest pain, palpitations and leg swelling.  Gastrointestinal: Positive for constipation (intermittent). Negative for abdominal pain, blood in stool, diarrhea, heartburn, nausea and vomiting.  Genitourinary: Negative for dysuria, frequency, hematuria and urgency.  Musculoskeletal: Negative for back pain, joint pain and myalgias.  Skin: Negative for itching and rash.  Neurological: Positive for dizziness (with panic attack), tingling (in right arm with most recent panic attack) and headaches.  Endo/Heme/Allergies: Negative for environmental allergies and polydipsia. Does not bruise/bleed easily.       Negative for hirsutism   Psychiatric/Behavioral: Negative for depression. The patient is nervous/anxious and has insomnia (sometimes).     Past Medical History:  Past Medical History:  Diagnosis Date  . Anxiety   . Costochondritis   . Hx of migraine headaches    common migraines    Past Surgical History:  Past Surgical History:  Procedure Laterality Date  . MYRINGOTOMY    . WISDOM TOOTH EXTRACTION      Family History:  Family History  Problem Relation Age of Onset  .  Pancreatic cancer Maternal Grandmother   . Lung cancer Maternal Grandfather   . Ovarian cancer Paternal Grandmother     Social History:  Social History   Socioeconomic History  . Marital status: Single    Spouse name: Not on file  . Number of children: 0  . Years of education: Not on file  . Highest education level: Not on file  Occupational History  . 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 Financial resource  strain: Not on file  . Food insecurity:    Worry: Not on file    Inability: Not on file  . Transportation needs:    Medical: Not on file    Non-medical: Not on file  Tobacco Use  . Smoking status: Never Smoker  . Smokeless tobacco: Never Used  Substance and Sexual Activity  . Alcohol use: Yes    Comment: Social   . Drug use: No  . Sexual activity: Not Currently    Birth control/protection: Condom  Lifestyle  . Physical activity:    Days per week: 3 days    Minutes per session: 50 min  . Stress: To some extent  Relationships  . Social connections:    Talks on phone: More than three times a week    Gets together: More than three times a week    Attends religious service: More than 4 times per year    Active member of club or organization: No    Attends meetings of clubs or organizations: Never    Relationship status: Never married  . Intimate partner violence:    Fear of current or ex partner: No    Emotionally abused: No    Physically abused: No    Forced sexual activity: No  Other Topics Concern  . Not on file  Social History Narrative  . Not on file    Allergies:  No Known Allergies  Medications: Wellbutrin 150 mgm XL daily Current Outpatient Medications on File Prior to Visit  Medication Sig Dispense Refill  . omeprazole (PRILOSEC) 20 MG capsule Take by mouth.     No current facility-administered medications on file prior to visit.   Melatonin prn   Physical Exam Vitals: BP 120/68   Pulse 95   Ht 4\' 11"  (1.499 m)   Wt 142 lb (64.4 kg)   LMP 03/27/2018 (Approximate)   BMI 28.68 kg/m   General: WF in  NAD HEENT: normocephalic, anicteric Neck: no thyroid enlargement, no palpable nodules, no cervical lymphadenopathy  Pulmonary: No increased work of breathing, CTAB Cardiovascular: RRR, without murmur  Breast: Breast symmetrical, no tenderness, no palpable nodules or masses, no skin or nipple retraction present, no nipple discharge.  No axillary,  infraclavicular or supraclavicular lymphadenopathy. Abdomen: Soft, non-tender, non-distended.  Umbilicus pierced.  No hepatomegaly or masses palpable. No evidence of hernia. Genitourinary:  External: Normal external female genitalia.  Normal urethral meatus, normal Bartholin's and Skene's glands.    Vagina: Normal vaginal mucosa, no evidence of prolapse.    Cervix: no bleeding, non-tender, no lesions  Uterus: Anteverted, NSSC, deviated to the left, mobile, and non-tender  Adnexa: No adnexal masses, non-tender  Rectal: deferred  Lymphatic: no evidence of inguinal lymphadenopathy Extremities: no edema, erythema, or tenderness Neurologic: Grossly intact Psychiatric: mood appropriate, affect full     Assessment: 23 y.o. annual gyn exam  Anxiety-improved on Wellbutrin 150 mgm XL. DIscussed trial of Wellbutrin 300 mgm XL daily (to take in AM) vs adding anxiolytic like Buspar. She would prefer  trial of increased Wellbutrin dose.  Contraceptive management  Family history of ovarian cancer   Plan:   1) Breast cancer screening - recommend monthly self breast exam.  2) STI testing not indicated  3) Cervical cancer screening -Pap done  4) Contraception - Interested in starting [place]. We discussed benefits, risks, possible side effects, MOA, how to take and what to do for missed pills. Discussed thromboembolic risks and warning signs. Sample pack and RX for [place]. RTO for follow up in 2-3 months.  Offered MYRISK testing. Will await pathology on mother's breast biopsy.  Farrel Conners, CNM

## 2018-04-27 LAB — CYTOLOGY - PAP: DIAGNOSIS: NEGATIVE

## 2018-05-01 ENCOUNTER — Telehealth: Payer: Self-pay | Admitting: Physician Assistant

## 2018-05-01 NOTE — Telephone Encounter (Signed)
Patient scheduled visit for [facility] for complaint of sinus pressure, super hot and cold yesterday, headache, fever 101.29F, diarrhea.  Called patient on mobile. Discussed patient's symptoms. Patient reports sudden onset fatigue yesterday evening, body aches, sweats and chills, and fever. Associated sinus pressure/congestion. Fever resolved this morning. Still feels very tired. New onset diarrhea.  Patient concerned because she cannot go to work like this. 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  No recent travel. No known specific Covid19 exposure. However, [place] has community spread. Patient did receive this season's influenza vaccination. Patient with no complicating medical history. Patient denies chest pain or SOB. Denies concern for pregnancy. Patient is less than 57 years old.  Discussed with patient, does not appear at this time that she needs to go to the tent outside the ED for further evaluation/treatment.   Patient with [facility] insurance plan. Advised she perform E-Visit for Covid19 screening (will be free, no charge). Advised patient may discuss empiric treatment for influenza (no longer recommending flu testing). Patient stated she would most likely need a work note.  Patient agreed with plan. SFS PA-C

## 2018-06-23 IMAGING — CR DG CHEST 2V
2 series · 2 of 2 positions shown · non-contrast
Comparison: No priors.

CLINICAL DATA: 22-year-old female with history of substernal chest
pain intermittently for the past 4 days.

EXAM:
CHEST - 2 VIEW

[chest pa]
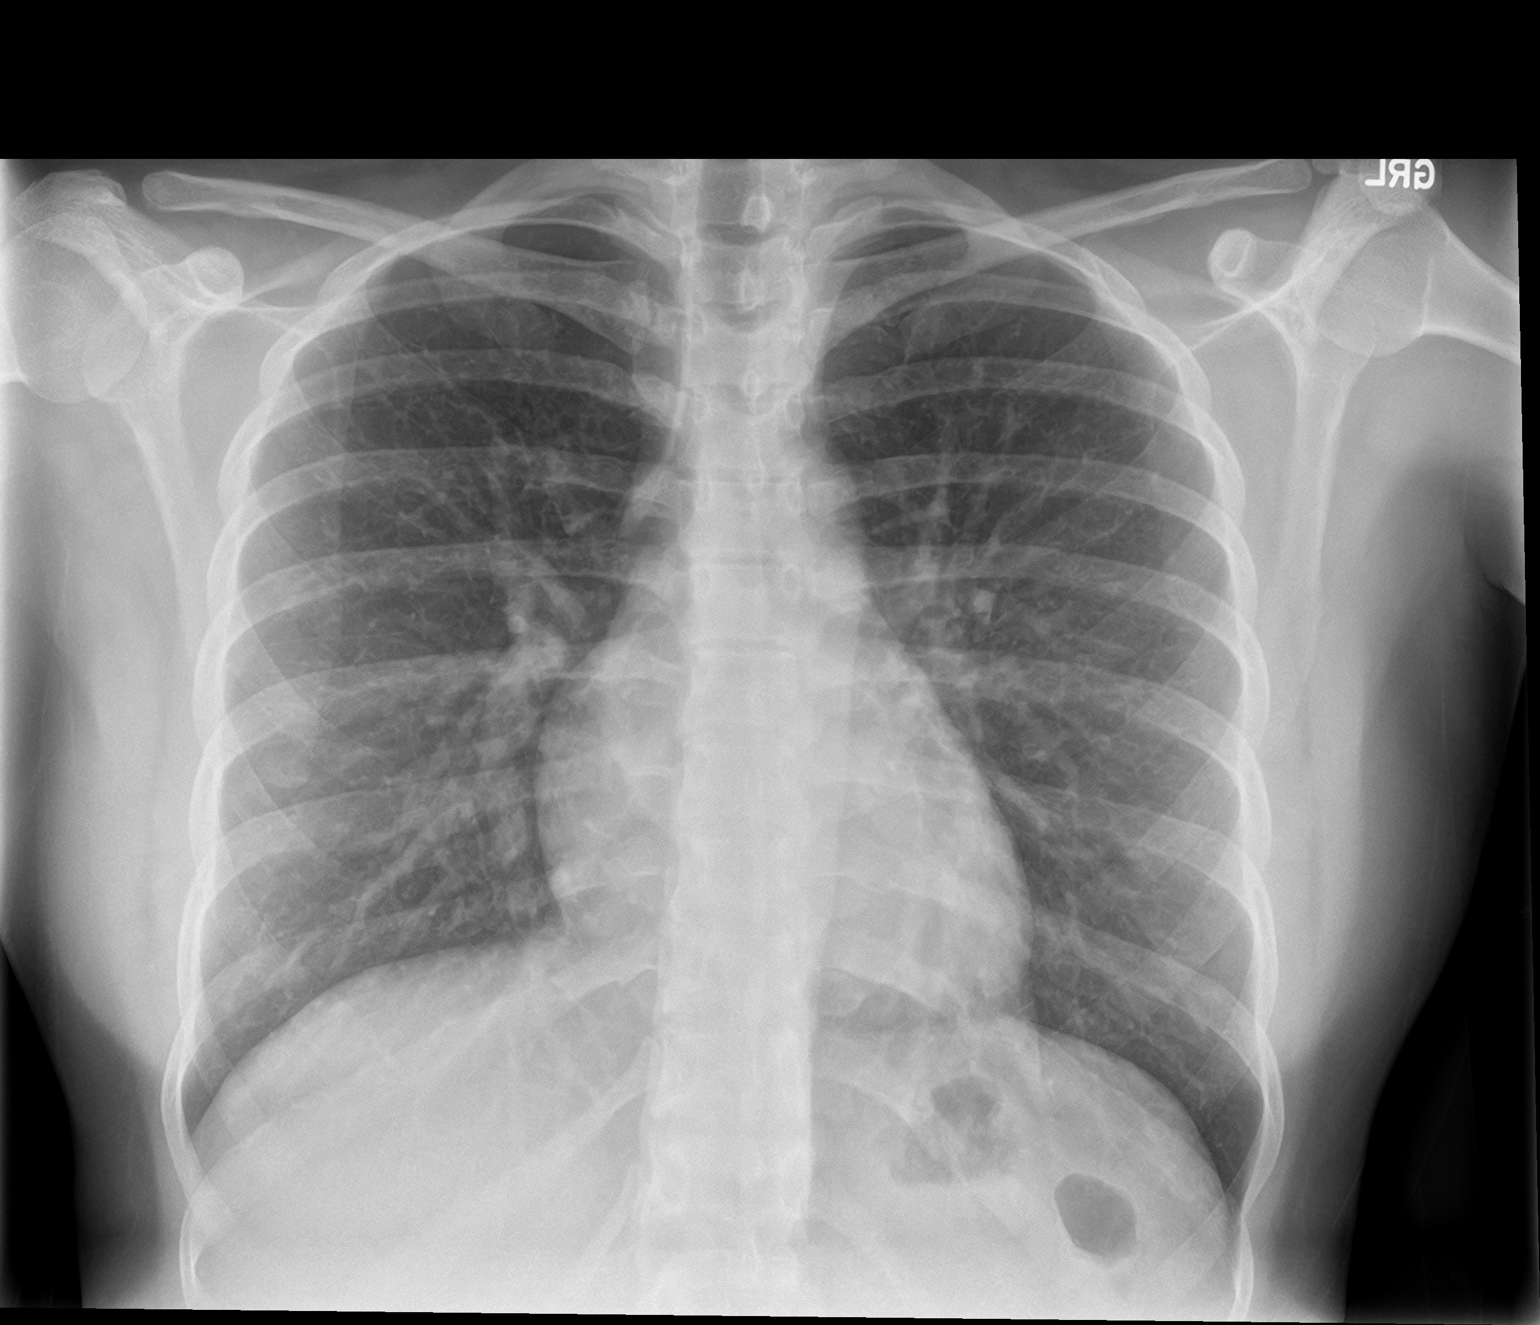

[chest lat]
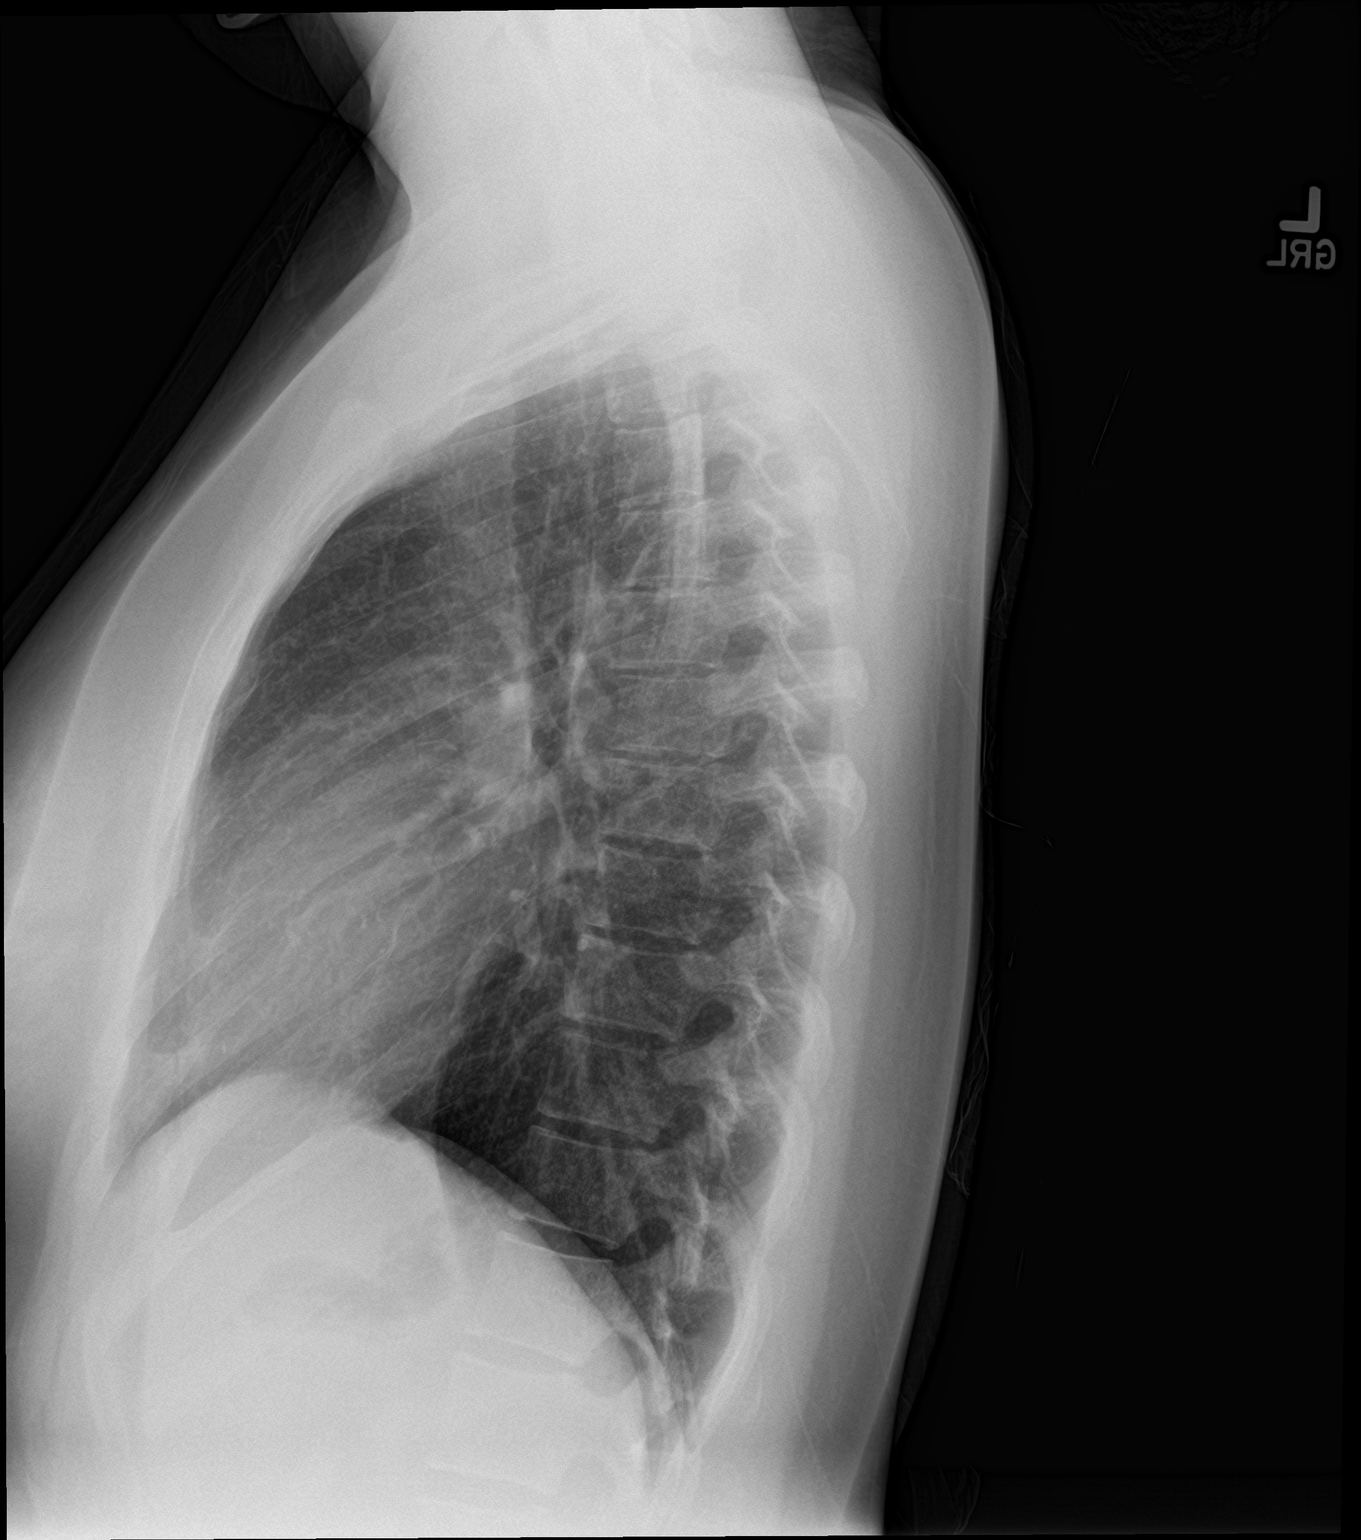

[2 of 2 positions shown; findings below may reference images not displayed]

FINDINGS: Lung volumes are normal. No consolidative airspace disease. No
pleural effusions. No pneumothorax. No pulmonary nodule or mass
noted. Pulmonary vasculature and the cardiomediastinal silhouette
are within normal limits.
IMPRESSION: No radiographic evidence of acute cardiopulmonary disease.

## 2018-06-25 DIAGNOSIS — F411 Generalized anxiety disorder: Secondary | ICD-10-CM | POA: Diagnosis not present

## 2018-06-25 DIAGNOSIS — F3342 Major depressive disorder, recurrent, in full remission: Secondary | ICD-10-CM | POA: Diagnosis not present

## 2018-06-25 DIAGNOSIS — M940 Chondrocostal junction syndrome [Tietze]: Secondary | ICD-10-CM | POA: Diagnosis not present

## 2018-07-03 DIAGNOSIS — K219 Gastro-esophageal reflux disease without esophagitis: Secondary | ICD-10-CM | POA: Diagnosis not present

## 2018-07-03 DIAGNOSIS — F3340 Major depressive disorder, recurrent, in remission, unspecified: Secondary | ICD-10-CM | POA: Diagnosis not present

## 2018-07-03 DIAGNOSIS — F411 Generalized anxiety disorder: Secondary | ICD-10-CM | POA: Diagnosis not present

## 2018-07-03 DIAGNOSIS — Z0000 Encounter for general adult medical examination without abnormal findings: Secondary | ICD-10-CM | POA: Diagnosis not present

## 2018-07-04 ENCOUNTER — Other Ambulatory Visit: Payer: Self-pay

## 2018-07-04 ENCOUNTER — Encounter: Payer: Self-pay | Admitting: Certified Nurse Midwife

## 2018-07-04 ENCOUNTER — Ambulatory Visit (INDEPENDENT_AMBULATORY_CARE_PROVIDER_SITE_OTHER): Payer: 59 | Admitting: Certified Nurse Midwife

## 2018-07-04 VITALS — BP 122/68 | HR 103 | Ht 59.0 in | Wt 139.0 lb

## 2018-07-04 DIAGNOSIS — Z3041 Encounter for surveillance of contraceptive pills: Secondary | ICD-10-CM

## 2018-07-04 MED ORDER — LEVONORGEST-ETH ESTRAD 91-DAY 0.15-0.03 &0.01 MG PO TABS: 1.0000 | ORAL_TABLET | Freq: Every day | ORAL | 4 refills | Status: DC

## 2018-07-04 NOTE — Progress Notes (Signed)
  History of Present Illness:  Rachel Hebert is a 23 y.o. who was started on  Current Outpatient Medications on File Prior to Visit  Medication Sig Dispense Refill        . Norethin Ace-Eth Estrad-FE (TAYTULLA) 1-20 MG-MCG(24) CAPS Take 1 capsule by mouth daily. 84 capsule 3            approximately 2.5 months ago. She states that recently her prescription was filled with [facility]. Since her last withdrawal bleed (06/23/2018) she has been experiencing hot flashes. She also reports that she is having cramping with her menses although her bleeding is lighter. No BTB.  PMHx: She  has a past medical history of Anxiety, Costochondritis, and migraine headaches. Also,  has a past surgical history that includes Wisdom tooth extraction and Myringotomy., family history includes Lung cancer in her maternal grandfather; Ovarian cancer in her paternal grandmother; Pancreatic cancer in her maternal grandmother.,  reports that she has been smoking cigarettes. She has been smoking about 0.25 packs per day. She has never used smokeless tobacco. She reports current alcohol use. She reports that she does not use drugs.  She has a current medication list which includes the following prescription(s): bupropion, norethin ace-eth estrad-fe, and omeprazole. Also, has No Known Allergies.  ROS  Physical Exam:  BP 122/68   Pulse (!) 103   Ht 4\' 11"  (1.499 m)   Wt 139 lb (63 kg)   LMP 06/23/2018 (Exact Date)   BMI 28.07 kg/m  Body mass index is 28.07 kg/m. Constitutional: Well nourished, well developed female in no acute distress.  Neuro: Grossly intact Psych:  Normal mood and affect.    Assessment: Medication treatment is going adequately for her although she is experiencing some cramping and hot flashes on her current OCPs..  Plan: Discussed option of remaining on current pills or switching to an extended cycle pill like Seasonique.  She would like to try [facility]. Explained how to take [facility] and  possibility of break through bleeding and what to do if that occurs.  She was amenable to this plan and we will see her back for annual/PRN.  Farrel Conners, CNM [facility], [facility] 07/04/2018  4:22 PM

## 2018-08-21 ENCOUNTER — Other Ambulatory Visit: Payer: Self-pay

## 2018-08-21 ENCOUNTER — Ambulatory Visit: Payer: Self-pay | Admitting: Physician Assistant

## 2018-08-21 VITALS — BP 114/80 | HR 71 | Temp 98.4°F | Resp 18 | Wt 138.0 lb

## 2018-08-21 DIAGNOSIS — IMO0001 Reserved for inherently not codable concepts without codable children: Secondary | ICD-10-CM

## 2018-08-21 DIAGNOSIS — G8929 Other chronic pain: Secondary | ICD-10-CM

## 2018-08-21 LAB — POCT URINALYSIS DIPSTICK
Bilirubin, UA: NEGATIVE
Blood, UA: NEGATIVE
Glucose, UA: NEGATIVE
Ketones, UA: NEGATIVE
Leukocytes, UA: NEGATIVE
Nitrite, UA: NEGATIVE
Protein, UA: NEGATIVE
Spec Grav, UA: 1.01 (ref 1.010–1.025)
Urobilinogen, UA: 0.2 E.U./dL
pH, UA: 7.5 (ref 5.0–8.0)

## 2018-08-21 LAB — POCT URINE PREGNANCY: Preg Test, Ur: NEGATIVE

## 2018-08-21 MED ORDER — PREDNISONE 20 MG PO TABS: ORAL_TABLET | ORAL | 0 refills | Status: DC

## 2018-08-21 MED ORDER — METHOCARBAMOL 500 MG PO TABS: 500.0000 mg | ORAL_TABLET | Freq: Three times a day (TID) | ORAL | 0 refills | Status: DC

## 2018-08-21 NOTE — Patient Instructions (Addendum)
Thank you for choosing [facility] for your health care needs today.  You have been diagnosed with: 1. Chronic bilateral low back pain, unspecified whether sciatica present - predniSONE (DELTASONE) 20 MG tablet; Take 3 tabs po qd x 3 days, then 2 tabs po qd x 4 days  Dispense: 17 tablet; Refill: 0 - methocarbamol (ROBAXIN) 500 MG tablet; Take 1 tablet (500 mg total) by mouth 3 (three) times daily.  Dispense: 12 tablet; Refill: 0  2. Paresthesias/numbness  Take prescription medications as prescribed. Take prednisone in the morning. Take with food to prevent stomach upset. Take muscle relaxer, methocarbamol, at night. May cause tiredness. Do not take prior to work.  Attached are some low back exercises.  Recommend applying heat/warm compress, 15-20 minutes at a time, several times a day. May use over the counter IcyHot, BioFreeze, or SalonPas.  Recommend following up with your family physician. Due to duration of time of low back pain; believe you would benefit from imaging, prescription for physical therapy, and/or referral to orthopedist.  Also, recommend following up with your family physician for further evaluation of bilateral leg pain/paresthesias. May be associated with low back pain, may not.  Go directly to the ED if you develop numbness in your groin, bowel/bladder changes, leg weakness, worsening back pain or worsening leg pain.  Hope you feel better soon!   Acute Back Pain, Adult Acute back pain is sudden and usually short-lived. It is often caused by an injury to the muscles and tissues in the back. The injury may result from:  A muscle or ligament getting overstretched or torn (strained). Ligaments are tissues that connect bones to each other. Lifting something improperly can cause a back strain.  Wear and tear (degeneration) of the spinal disks. Spinal disks are circular tissue that provides cushioning between the bones of the spine (vertebrae).  Twisting motions, such as  while playing sports or doing yard work.  A hit to the back.  Arthritis. You may have a physical exam, lab tests, and imaging tests to find the cause of your pain. Acute back pain usually goes away with rest and home care. Follow these instructions at home: Managing pain, stiffness, and swelling  Take over-the-counter and prescription medicines only as told by your health care provider.  Your health care provider may recommend applying ice during the first 24-48 hours after your pain starts. To do this: ? Put ice in a plastic bag. ? Place a towel between your skin and the bag. ? Leave the ice on for 20 minutes, 2-3 times a day.  If directed, apply heat to the affected area as often as told by your health care provider. Use the heat source that your health care provider recommends, such as a moist heat pack or a heating pad. ? Place a towel between your skin and the heat source. ? Leave the heat on for 20-30 minutes. ? Remove the heat if your skin turns bright red. This is especially important if you are unable to feel pain, heat, or cold. You have a greater risk of getting burned. Activity   Do not stay in bed. Staying in bed for more than 1-2 days can delay your recovery.  Sit up and stand up straight. Avoid leaning forward when you sit, or hunching over when you stand. ? If you work at a desk, sit close to it so you do not need to lean over. Keep your chin tucked in. Keep your neck drawn back, and keep your  elbows bent at a right angle. Your arms should look like the letter "L." ? Sit high and close to the steering wheel when you drive. Add lower back (lumbar) support to your car seat, if needed.  Take short walks on even surfaces as soon as you are able. Try to increase the length of time you walk each day.  Do not sit, drive, or stand in one place for more than 30 minutes at a time. Sitting or standing for long periods of time can put stress on your back.  Do not drive or use  heavy machinery while taking prescription pain medicine.  Use proper lifting techniques. When you bend and lift, use positions that put less stress on your back: ? Matlock your knees. ? Keep the load close to your body. ? Avoid twisting.  Exercise regularly as told by your health care provider. Exercising helps your back heal faster and helps prevent back injuries by keeping muscles strong and flexible.  Work with a physical therapist to make a safe exercise program, as recommended by your health care provider. Do any exercises as told by your physical therapist. Lifestyle  Maintain a healthy weight. Extra weight puts stress on your back and makes it difficult to have good posture.  Avoid activities or situations that make you feel anxious or stressed. Stress and anxiety increase muscle tension and can make back pain worse. Learn ways to manage anxiety and stress, such as through exercise. General instructions  Sleep on a firm mattress in a comfortable position. Try lying on your side with your knees slightly bent. If you lie on your back, put a pillow under your knees.  Follow your treatment plan as told by your health care provider. This may include: ? Cognitive or behavioral therapy. ? Acupuncture or massage therapy. ? Meditation or yoga. Contact a health care provider if:  You have pain that is not relieved with rest or medicine.  You have increasing pain going down into your legs or buttocks.  Your pain does not improve after 2 weeks.  You have pain at night.  You lose weight without trying.  You have a fever or chills. Get help right away if:  You develop new bowel or bladder control problems.  You have unusual weakness or numbness in your arms or legs.  You develop nausea or vomiting.  You develop abdominal pain.  You feel faint. Summary  Acute back pain is sudden and usually short-lived.  Use proper lifting techniques. When you bend and lift, use positions that  put less stress on your back.  Take over-the-counter and prescription medicines and apply heat or ice as directed by your health care provider. This information is not intended to replace advice given to you by your health care provider. Make sure you discuss any questions you have with your health care provider. Document Released: 01/24/2005 Document Revised: 05/15/2018 Document Reviewed: 09/07/2016 Elsevier Patient Education  2020 [place] [facility] Ask your health care provider which exercises are safe for you. Do exercises exactly as told by your health care provider and adjust them as directed. It is normal to feel mild stretching, pulling, tightness, or discomfort as you do these exercises. Stop right away if you feel sudden pain or your pain gets worse. Do not begin these exercises until told by your health care provider. Stretching and range-of-motion exercises These exercises warm up your muscles and joints and improve the movement and flexibility  of your back. These exercises also help to relieve pain, numbness, and tingling. Lumbar rotation  1. Lie on your back on a firm surface and bend your knees. 2. Straighten your arms out to your sides so each arm forms a 90-degree angle (right angle) with a side of your body. 3. Slowly move (rotate) both of your knees to one side of your body until you feel a stretch in your lower back (lumbar). Try not to let your shoulders lift off the floor. 4. Hold this position for __________ seconds. 5. Tense your abdominal muscles and slowly move your knees back to the starting position. 6. Repeat this exercise on the other side of your body. Repeat __________ times. Complete this exercise __________ times a day. Single knee to chest  1. Lie on your back on a firm surface with both legs straight. 2. Bend one of your knees. Use your hands to move your knee up toward your chest until you feel a gentle stretch in your lower  back and buttock. ? Hold your leg in this position by holding on to the front of your knee. ? Keep your other leg as straight as possible. 3. Hold this position for __________ seconds. 4. Slowly return to the starting position. 5. Repeat with your other leg. Repeat __________ times. Complete this exercise __________ times a day. Prone extension on elbows  1. Lie on your abdomen on a firm surface (prone position). 2. Prop yourself up on your elbows. 3. Use your arms to help lift your chest up until you feel a gentle stretch in your abdomen and your lower back. ? This will place some of your body weight on your elbows. If this is uncomfortable, try stacking pillows under your chest. ? Your hips should stay down, against the surface that you are lying on. Keep your hip and back muscles relaxed. 4. Hold this position for __________ seconds. 5. Slowly relax your upper body and return to the starting position. Repeat __________ times. Complete this exercise __________ times a day. Strengthening exercises These exercises build strength and endurance in your back. Endurance is the ability to use your muscles for a long time, even after they get tired. Pelvic tilt This exercise strengthens the muscles that lie deep in the abdomen. 1. Lie on your back on a firm surface. Bend your knees and keep your feet flat on the floor. 2. Tense your abdominal muscles. Tip your pelvis up toward the ceiling and flatten your lower back into the floor. ? To help with this exercise, you may place a small towel under your lower back and try to push your back into the towel. 3. Hold this position for __________ seconds. 4. Let your muscles relax completely before you repeat this exercise. Repeat __________ times. Complete this exercise __________ times a day. Alternating arm and leg raises  1. Get on your hands and knees on a firm surface. If you are on a hard floor, you may want to use padding, such as an exercise  mat, to cushion your knees. 2. Line up your arms and legs. Your hands should be directly below your shoulders, and your knees should be directly below your hips. 3. Lift your left leg behind you. At the same time, raise your right arm and straighten it in front of you. ? Do not lift your leg higher than your hip. ? Do not lift your arm higher than your shoulder. ? Keep your abdominal and back muscles tight. ? Keep  your hips facing the ground. ? Do not arch your back. ? Keep your balance carefully, and do not hold your breath. 4. Hold this position for __________ seconds. 5. Slowly return to the starting position. 6. Repeat with your right leg and your left arm. Repeat __________ times. Complete this exercise __________ times a day. Abdominal set with straight leg raise  1. Lie on your back on a firm surface. 2. Bend one of your knees and keep your other leg straight. 3. Tense your abdominal muscles and lift your straight leg up, 4-6 inches (10-15 cm) off the ground. 4. Keep your abdominal muscles tight and hold this position for __________ seconds. ? Do not hold your breath. ? Do not arch your back. Keep it flat against the ground. 5. Keep your abdominal muscles tense as you slowly lower your leg back to the starting position. 6. Repeat with your other leg. Repeat __________ times. Complete this exercise __________ times a day. Single leg lower with bent knees 1. Lie on your back on a firm surface. 2. Tense your abdominal muscles and lift your feet off the floor, one foot at a time, so your knees and hips are bent in 90-degree angles (right angles). ? Your knees should be over your hips and your lower legs should be parallel to the floor. 3. Keeping your abdominal muscles tense and your knee bent, slowly lower one of your legs so your toe touches the ground. 4. Lift your leg back up to return to the starting position. ? Do not hold your breath. ? Do not let your back arch. Keep your  back flat against the ground. 5. Repeat with your other leg. Repeat __________ times. Complete this exercise __________ times a day. Posture and body mechanics Good posture and healthy body mechanics can help to relieve stress in your body's tissues and joints. Body mechanics refers to the movements and positions of your body while you do your daily activities. Posture is part of body mechanics. Good posture means:  Your spine is in its natural S-curve position (neutral).  Your shoulders are pulled back slightly.  Your head is not tipped forward. Follow these guidelines to improve your posture and body mechanics in your everyday activities. Standing   When standing, keep your spine neutral and your feet about hip width apart. Keep a slight bend in your knees. Your ears, shoulders, and hips should line up.  When you do a task in which you stand in one place for a long time, place one foot up on a stable object that is 2-4 inches (5-10 cm) high, such as a footstool. This helps keep your spine neutral. Sitting   When sitting, keep your spine neutral and keep your feet flat on the floor. Use a footrest, if necessary, and keep your thighs parallel to the floor. Avoid rounding your shoulders, and avoid tilting your head forward.  When working at a desk or a computer, keep your desk at a height where your hands are slightly lower than your elbows. Slide your chair under your desk so you are close enough to maintain good posture.  When working at a computer, place your monitor at a height where you are looking straight ahead and you do not have to tilt your head forward or downward to look at the screen. Resting  When lying down and resting, avoid positions that are most painful for you.  If you have pain with activities such as sitting, bending, stooping, or squatting,  lie in a position in which your body does not bend very much. For example, avoid curling up on your side with your arms and  knees near your chest (fetal position).  If you have pain with activities such as standing for a long time or reaching with your arms, lie with your spine in a neutral position and bend your knees slightly. Try the following positions: ? Lying on your side with a pillow between your knees. ? Lying on your back with a pillow under your knees. Lifting   When lifting objects, keep your feet at least shoulder width apart and tighten your abdominal muscles.  Bend your knees and hips and keep your spine neutral. It is important to lift using the strength of your legs, not your back. Do not lock your knees straight out.  Always ask for help to lift heavy or awkward objects. This information is not intended to replace advice given to you by your health care provider. Make sure you discuss any questions you have with your health care provider. Document Released: 01/24/2005 Document Revised: 05/18/2018 Document Reviewed: 02/15/2018 Elsevier Patient Education  2020 [employer]

## 2018-08-21 NOTE — Progress Notes (Signed)
Patient ID: Rachel Hebert DOB: February 08, 1995 AGE: 23 y.o. MRN: [id number]   PCP: Tracie Harrier, MD   Chief Complaint:  Chief Complaint  Patient presents with  . Back Pain    x2wk on and off     Subjective:    HPI:  Rachel Hebert is a 23 y.o. female presents for evaluation  Chief Complaint  Patient presents with  . Back Pain    x2wk on and off    23 year old female presents to [facility] with several month history of low back pain. Equal bilaterally. Band across lower back, also involves coccyx. Began as mild and intermittent. No precipitating injury/trauma. No known cause such as new job, new workout regimen, etc. Gradually worsened. Now more frequent, still not constant. More severe. Prior to bed, will rotate trunk to crack back, provides some relief. Is not woken up in the middle of the night due to pain. Sometimes wakes up with pain, sometimes does not. Associated few weeks of bilateral leg pain. Describes as ache with tingling. Tingling primarily in feet. Suspects may be due to shoes. Leg pain present with activity; such as when running during job. 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 Denies saddle anesthesia, bowel/bladder incontinence, urinary retention, sharp/shooting radiating pain, leg weakness/instability. Denies fever, chills, sweats, diffuse body aches, nausea/vomiting.  Patient with several year history of back pain. Patient seen for similar low back pain on 11/25/2016, two years ago, at [facility]. Diagnosed as muculoskeletal secondary to lifting. Treated with Flexeril and Mobic.  Patient denies previous imaging of back; including lumbar x-ray or MRI.  Patient denies known family history of back issues, neurovascular disease, or immune disorder such as MS. Patient regularly followed by Dr. Tracie Harrier MD with [facility] in [place], [place]. Made appt in regards to back for tomorrow; unsure if she  will be able to keep appt due to full work day. Underwent labs including CBC, BMP, lipids, Hbcg AIC, etc on 04/22/2017 as part of annual physical. Cholesterol elevated; patient advised to start a low fat diet. Patient admits to some recent weight loss, has been trying new diet. Patient's A1C was 5.1. Patient reports family history of preDM2.  A limited review of symptoms was performed, pertinent positives and negatives as mentioned in HPI.  The following portions of the patient's history were reviewed and updated as appropriate: allergies, current medications and past medical history.  Patient Active Problem List   Diagnosis Date Noted  . GAD (generalized anxiety disorder) 05/01/2017  . Anxiety 04/13/2017  . Panic attack 04/13/2017    No Known Allergies  Current Outpatient Medications on File Prior to Visit  Medication Sig Dispense Refill  . buPROPion (WELLBUTRIN XL) 300 MG 24 hr tablet Take 1 tablet (300 mg total) by mouth daily. 30 tablet 2  . Levonorgestrel-Ethinyl Estradiol (AMETHIA) 0.15-0.03 &0.01 MG tablet Take 1 tablet by mouth daily. 1 Package 4  . omeprazole (PRILOSEC) 20 MG capsule Take by mouth.     No current facility-administered medications on file prior to visit.        Objective:   Vitals:   08/21/18 1626  BP: 114/80  Pulse: 71  Resp: 18  Temp: 98.4 F (36.9 C)  SpO2: 99%     Wt Readings from Last 3 Encounters:  08/21/18 138 lb (62.6 kg)  07/04/18 139 lb (63 kg)  04/25/18 142 lb (64.4 kg)    Physical Exam:   General  Appearance:  Patient sitting comfortably on examination table. Conversational. Peri JeffersonGood self-historian. In no acute distress. Afebrile.   Head:  Normocephalic, without obvious abnormality, atraumatic  Eyes:  PERRL, conjunctiva/corneas clear, EOM's intact  Neck: Supple, symmetrical, trachea midline, no adenopathy  Lungs:   Clear to auscultation bilaterally, respirations unlabored. Good aeration. No rales, rhonchi, crackles or wheezing.  Heart:   Regular rate and rhythm  Extremities: Extremities normal, atraumatic, no cyanosis or edema  Pulses: 2+ and symmetric  Skin: Skin color, texture, turgor normal, no rashes or lesions  Lymph nodes: Cervical, supraclavicular, and axillary nodes normal  Neurologic: Back normal to inspection. No scoliosis. No rash. No edema or ecchymosis. Mild midline tenderness at superior aspect of lumbar spine. No palpable stepoff, deformity, or crepitus. Moderate tenderness with palpation along lumbar paraspinal musculature, with palpable muscle spasm along right side. Mild tenderness with palpation at bilateral SI joints and coccyx. No tenderness at piriformis. Full ROM of back; no pain with rotation, limited discomfort with lateral flexion, moderate pain with forward flexion (patient able to touch toes), no pain with extension. 5/5 lower extremity motor strength. Normal patellar reflex bilaterally. Patient reports pain with straight leg raise bilaterally; however, describes more of muscle/ligament tightness (lack of flexibility) than radiculopathy. Does not change/exacerbate/elicit lower leg pain or foot paresthesias. Sensation intact. Brisk capillary refill. No visible evidence of arterial/venous changes in lower extremities. No gait abnormality.    Assessment & Plan:    Exam findings, diagnosis etiology and medication use and indications reviewed with patient. Follow-Up and discharge instructions provided. No emergent/urgent issues found on exam.  Patient education was provided.   Patient verbalized understanding of information provided and agrees with plan of care (POC), all questions answered. The patient is advised to call or return to clinic if condition does not see an improvement in symptoms, or to seek the care of the closest emergency department if condition worsens with the below plan.    Recent Results (from the past 2160 hour(s))  POCT Urinalysis Dipstick     Status: Normal   Collection Time:  08/21/18  5:07 PM  Result Value Ref Range   Color, UA yellow    Clarity, UA clear    Glucose, UA Negative Negative   Bilirubin, UA neg    Ketones, UA neg    Spec Grav, UA 1.010 1.010 - 1.025   Blood, UA neg    pH, UA 7.5 5.0 - 8.0   Protein, UA Negative Negative   Urobilinogen, UA 0.2 0.2 or 1.0 E.U./dL   Nitrite, UA neg    Leukocytes, UA Negative Negative   Appearance     Odor    POCT urine pregnancy     Status: Normal   Collection Time: 08/21/18  5:09 PM  Result Value Ref Range   Preg Test, Ur Negative Negative    1. Chronic bilateral low back pain, unspecified whether sciatica present - predniSONE (DELTASONE) 20 MG tablet; Take 3 tabs po qd x 3 days, then 2 tabs po qd x 4 days  Dispense: 17 tablet; Refill: 0 - methocarbamol (ROBAXIN) 500 MG tablet; Take 1 tablet (500 mg total) by mouth 3 (three) times daily.  Dispense: 12 tablet; Refill: 0 - POCT Urinalysis Dipstick - POCT urine pregnancy  2. Paresthesias/numbness  23 year old female presents with several month history of low back pain. Associated bilateral leg pain and foot tingling. No precipitating injury/trauma. No known personal or family history of neuromuscular disease. VSS, afebrile, in no acute distress,  no red flags (fever, weight loss, night sweats, saddle anesthesia, bowel/bladder changes, leg weakness). Prescribed prednisone 60mg  1-week taper and muscle relaxer (Methocarbamol, qhs). Advised warm compresses/heating pad, OTC Bengay/IcyHot/BioFreeze, and stretching exercises. Advised f/u with PCP; due to duration of symptoms (previous episode 2 years ago); believe patient may benefit from imaging, PT, and/or referral to orthopedist. Also discussed with patient, leg symptoms not classic radiculopathy. May be related to low back pain; however could be DM2 (though semi-recent 5.1 A1C and no glycosuria), vitamin deficiency, thyroid dysfunction (semi-recent normal TSH), vascular disease, neuromuscular disorder, etc. Should be  discussed/evaluated by PCP. Advised immediate ED evaluation if fever, worsening back/leg pain, saddle anesthesia, bowel/bladder changes, leg weakness, or new/concerning symptom. Patient agreed with plan.    Janalyn HarderSamantha Maleta Hebert, MHS, PA-C Rulon SeraSamantha F. Demmi Hebert, MHS, PA-C Advanced Practice Provider [facility]  [facility]  [phone] [place], [place] [address] (p): [phone] [email or web address]

## 2018-08-30 DIAGNOSIS — M5442 Lumbago with sciatica, left side: Secondary | ICD-10-CM | POA: Diagnosis not present

## 2018-08-30 DIAGNOSIS — M545 Low back pain: Secondary | ICD-10-CM | POA: Diagnosis not present

## 2018-09-10 ENCOUNTER — Ambulatory Visit: Payer: 59 | Attending: Family Medicine | Admitting: Physical Therapy

## 2018-09-10 ENCOUNTER — Other Ambulatory Visit: Payer: Self-pay

## 2018-09-10 ENCOUNTER — Encounter: Payer: Self-pay | Admitting: Physical Therapy

## 2018-09-10 DIAGNOSIS — M5441 Lumbago with sciatica, right side: Secondary | ICD-10-CM | POA: Insufficient documentation

## 2018-09-10 DIAGNOSIS — M5442 Lumbago with sciatica, left side: Secondary | ICD-10-CM | POA: Insufficient documentation

## 2018-09-10 DIAGNOSIS — M62838 Other muscle spasm: Secondary | ICD-10-CM | POA: Insufficient documentation

## 2018-09-10 DIAGNOSIS — M6281 Muscle weakness (generalized): Secondary | ICD-10-CM | POA: Insufficient documentation

## 2018-09-10 DIAGNOSIS — R202 Paresthesia of skin: Secondary | ICD-10-CM | POA: Insufficient documentation

## 2018-09-10 NOTE — Therapy (Signed)
[facility] [address] [place], [address] Phone: [phone]   Fax:  [phone]  Physical Therapy Evaluation  Patient Details  Name: Rachel Hebert MRN: [id number] Date of Birth: 01/14/1996 Referring Provider (PT): Wilford Corner, [place]   Encounter Date: 09/10/2018  PT End of Session - 09/10/18 1659    Visit Number  1    Number of Visits  12    Date for PT Re-Evaluation  10/22/18    Authorization Type  [employer] Employee reporting period from 09/10/2018    Authorization Time Period  Current Cert period: 09/10/2018 - 10/22/2018 (latest PN: IE 09/10/2018);    Authorization - Visit Number  1    Authorization - Number of Visits  10    PT Start Time  1700    PT Stop Time  1755    PT Time Calculation (min)  55 min    Activity Tolerance  Patient tolerated treatment well    Behavior During Therapy  WFL for tasks assessed/performed       Past Medical History:  Diagnosis Date  . Anxiety   . Costochondritis   . Hx of migraine headaches    common migraines    Past Surgical History:  Procedure Laterality Date  . MYRINGOTOMY    . WISDOM TOOTH EXTRACTION      There were no vitals filed for this visit.   Subjective Assessment - 09/10/18 1935    Subjective  Patient reports she has had back pain for a while, but she has put it off. Probably over 2 years ago. Recently it has started to hurt more with a sharper pain in the left side and goes down leg to the toes with tingling on the left side. She works as a Lawyer. She reports she noticed it get worse around April. The only thing that changed was that COVID19 became a pandemic not long before this but her work did not change. Some days she feels it more sitting, laying, and standing. Other days it just comes and goes. Back pain is variable. Let pain is less often. If she has been up running around all day. She thinks she feels this several times a week. She  used to play some track and soccer in high school but her favorite was choir. She reports there is a spot on her back where if she puts pressure there with her hand it hurts a lot.    Pertinent History  Patient is a 23 y.o. female who presents to outpatient physical therapy with a referral for medical diagnosis lower back pain with sciatica. This patient's chief complaints consist of acute on chronic low back pain with most recent flare in April including new bilateral leg symptoms, L > R, leading to the following functional deficits: difficulty sleeping, bending, working, sitting, standing, lifting, pushing, pulling, with quality of life and usual ADLs, IADLs, community participation, and work. Relevant past medical history and comorbidities include acid reflux, B shoulder pain, anxiety/depresssion, anxiety attacks. Denies recent changes in bowel/bladder, increased stumbling, saddle paresthesia, unexplained weight loss, nausea, facial numbness.    Limitations  Sitting;House hold activities;Other (comment);Lifting;Standing;Walking    How long can you sit comfortably?  variable    How long can you stand comfortably?  variable    How long can you walk comfortably?  variable    Diagnostic tests  Recent lumbar radiographs showed no abnormalities according to patient.    Patient Stated Goals  to return to PLOF without pain    Currently in Pain?  Yes    Pain Score  2    best 0/10, at worst 8-9/10.   Pain Location  Back   eft upper lumbar spine and lowest segment of spine, radiates to sacrum intermittently. Intermittent sharp pain down lateral side of left leg (cannot remember if it is below knee), tingling at bottom of L foot when pain is down leg.   Pain Orientation  --   left upper lumbar spine and lowest segment of spine, radiates to sacrum intermittently. Intermittent sharp pain down lateral side of left leg (cannot remember if it is below knee), tingling at bottom of L foot when pain is down leg.    Pain Descriptors / Indicators  --   left upper lumbar spine and lowest segment of spine, radiates to sacrum intermittently. Intermittent sharp pain down lateral side of left leg (cannot remember if it is below knee), tingling at bottom of L foot when pain is down leg.   Pain Type  Acute pain;Chronic pain;Neuropathic pain    Pain Radiating Towards  left upper lumbar spine and lowest segment of spine, radiates to sacrum intermittently. Intermittent sharp pain down lateral side of left leg (cannot remember if it is below knee), tingling at bottom of L foot when pain is down leg.    Pain Onset  More than a month ago    Pain Frequency  Intermittent    Aggravating Factors   variable, maybe working a long time (standing mostly), laying flat on back. 24-hour pattern: variable, seems to notice at work the most or when going to bed and trying to go to sleep.    Pain Relieving Factors  heat, used to feel better if she popped her back, prednisone and muscle relaxant had no effect.    Effect of Pain on Daily Activities  difficulty sleeping, bending, working, sitting, standing, lifting, pushing, pulling, with quality of life and usual ADLs, IADLs, community participation, and work.       [facility] PT Assessment - 09/10/18 0001      Assessment   Medical Diagnosis  lower back pain with sciatica    Referring Provider (PT)  Harlon FlorWhitaker, Jonnie FinnerJason Hestle, PA-C    Onset Date/Surgical Date  05/09/18   estimate when recent flare up started   Prior Therapy  none for this problem prior to this episode of care      Precautions   Precautions  None      Restrictions   Weight Bearing Restrictions  No      Home Environment   Living Environment  --   alone, no concerns about home environment at this point     Prior Function   Level of Independence  Independent    Vocation  Full time employment   CNA   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 No restrictions at work.     Leisure   going on hikes, walking in the park (decreased lately due to hurting a lot). Go to the gym (not since it was closed for COVID19 in March; was doing treadmill - 15-60 min, lat pull, pec flys, hip adductor, 3x10 each, squats and deadlifts but not with barbell, did some crunches and can't do sit-ups (always struggled); 6 days a week, was working on losing weight, Started in January ut didn't fall into  place until February), visit with sister      Cognition   Overall Cognitive Status  Within Functional Limits for tasks assessed      Observation/Other Assessments   Observations  see note from 09/10/2018 for latest objective data    Focus on Therapeutic Outcomes (FOTO)   FOTO = 57 (09/10/2018)              OBJECTIVE:  Baseline: 2/10 in low back.   OBSERVATION/INSPECTION: Patient presents with increased lumbar lordosis, hyperkyphosis at CT junction Noted for significant adipose tissue at abdomen compared to throughout body.   NEUROLOGICAL: Dermatomes: BLE = equal and intact to light touch Myotomes: possibly slight deficit in L L5 myotome (mild) Reflexes:  - Biceps brachii reflex (C5, C6): B = 3+ - Brachioradialis reflex (C6): B = 3+ - Triceps brachii reflex (C7): B = 3+. - Quadriceps reflex (L4): B = 3+ - Achilles reflex (S1): B = 3+ Upper Motor Neuron Screen: Babinski, Hoffman's, and Clonus (ankle) negative bilaterally.  SPINE MOTION Lumbar Spine AROM:  *Indicates pain - Flexion: = ankles, 100%. - Extension: = 100%, pain in low back and R low back. - Rotation: R= 100% painful, L = 100% no pain. - Side Flexion: B = 100% pain  Noted tingling in L leg following all motion, but unsure when it started.    PERIPHERAL JOINT MOTION (AROM/PROM in degrees):  *Indicates pain -   Hip = B WFL except painful at end ranges for flexion, IR, and ER bilaterally.   Knees/Ankles = appears  grossly WFL for basic mobility  STRENGTH:  *Indicates pain 5/5 except otherwise noted below.  Hip  Flexion: R = 3+/5, L =5/5 (pain) - Extension: deferred - Abduction: R = 4+/5, L = 4+/5. Knee - Ext: R = 5/5, L = 5/5. - Flex: R = 4/5, L = 4/5. Ankle (seated position) - Dorsiflexion: R = 5/5, L = 5/5. - Eversion: R = 5/5, L = 4+/5. - Great toe extension: R = 5/5, L = 4+/5.  Trunk: unable to activate abdominal muscles voluntarily without curl up. Endurance in curl up limited to 5 seconds with just head off table.   REPEATED MOTIONS TESTING:   Repeated flexion in standing: during = ERP legs during and increasing back pain ; after = back worse  Repeated extension in standing: during = pain at base of spine (decreasing higher pain); after = no better/no worse  Prone press up: during = ERP between shoulder blades; after = no worse, no LE symptoms.   SPECIAL TESTS: Sustained Positioning: prone on elbows = worsening back pain Sustained long sitting: increased B foot paresthesia. Straight leg raise (SLR): R = postive, L = positive. Slump: R = negative, L = positive. FABER: R = negative, L = negative.  ACCESSORY MOTION:  - Tender to CPA along thoracic and lumbar spine, worst at lowest levels of lumbar spine and sacrum.   PALPATION: - TTP along lumbar paraspinals and bilateral glutes, L > R.  FUNCTIONAL MOBILITY: - Bed mobility: supine <> sit and rolling WFL except painful - Transfers: sit <> stand WFL except painful - Gait: WFL for household and short community distances, except intermittently painful.   EDUCATION/COGNITION: Patient is alert and oriented X 4.  Objective measurements completed on examination: See above findings.     TREATMENT:  Therapeutic exercise: to centralize symptoms and improve ROM, strength, muscular endurance, and activity tolerance required for successful completion of functional activities.  - abdominal activation with self-palpation -  abdominal  activation with self-palpation curl up x 10 with additional time for instruction  - Education on HEP including handout  - Education on diagnosis, prognosis, POC, anatomy and physiology of current condition.   HOME EXERCISE PROGRAM Access Code: [email or web address]  URL: [email or web address]  Date: 09/10/2018  Prepared by: Rosita Kea   Exercises  Neutral Curl Up with Straight Leg - 2 sets - 20 reps - 5-10 seconds hold - 1x daily  Seated Correct Posture   Patient response to treatment:  Pt tolerated treatment well. Pt was able to complete all exercises with minimal to no lasting increase in pain or discomfort. Pt required multimodal cuing for proper technique and to facilitate improved neuromuscular control, strength, range of motion, and functional ability resulting in improved performance and form.  Patient had some soreness from examination that subsided with rest.     PT Education - 09/10/18 1658    Education Details  Exercise purpose/form. Self management techniques. Education on diagnosis, prognosis, POC, anatomy and physiology of current condition Education on HEP including handout    Person(s) Educated  Patient    Methods  Explanation;Demonstration;Verbal cues;Handout    Comprehension  Verbalized understanding;Returned demonstration;Verbal cues required       PT Short Term Goals - 09/10/18 1948      PT SHORT TERM GOAL #1   Title  Be independent with initial home exercise program for self-management of symptoms.    Baseline  Initial HEP provided at IE (09/10/2018);    Time  2    Period  Weeks    Status  New    Target Date  09/24/18        PT Long Term Goals - 09/10/18 1949      PT LONG TERM GOAL #1   Title  Be independent with a long-term home exercise program for self-management of symptoms.    Baseline  Initial HEP provided at IE (09/10/2018);    Time  6    Period  Weeks    Status  New    Target Date  10/22/18      PT LONG TERM GOAL #2   Title  Reduce pain  with functional activities to equal or less than 1/10 to allow patient to complete usual activities including ADLs, IADLs, and social engagement with less difficulty.    Baseline  8/10 (09/10/2018);    Time  6    Period  Weeks    Status  New    Target Date  10/22/18      PT LONG TERM GOAL #3   Title  Demonstrate improved FOTO score by 10 units to demonstrate improvement in overall condition and self-reported functional ability.    Baseline  FOTO = 57 (09/10/2018);    Time  6    Period  Weeks    Status  New    Target Date  10/22/18      PT LONG TERM GOAL #4   Title  Complete community, work and/or recreational activities without limitation due to current condition.    Baseline  difficulty sleeping, bending, working, sitting, standing, lifting, pushing, pulling, with quality of life and usual ADLs, IADLs, community participation, and work.    Time  6    Period  Weeks    Status  New    Target Date  10/22/18      PT LONG TERM GOAL #5   Title  Patient will demonstrate ablility to hold curl up for 30 seconds to demonstrate improved abdominal  muscular endurance to improve support for lumbar spine during functional activities.    Baseline  5 seconds with only head lifted off mat (09/10/2018);    Time  6    Period  Weeks    Status  New    Target Date  10/22/18       Plan - 09/10/18 1943    Clinical Impression Statement  Patient is a 23 y.o. female referred to outpatient physical therapy with a medical diagnosis of lower back pain with sciatica who presents with signs and symptoms consistent with acute on chronic low back and thoracic spine pain with radiation to both LEs, R > L. Patient presents with significant pain, motor control, strength/endurance, paresthesia, activity tolerance, muscle spasm, stiffness, ROM impairments that are limiting ability to complete sleeping, bending, working, sitting, standing, lifting, pushing, pulling, with quality of life and usual ADLs, IADLs, community  participation, and work without difficulty. Patient does not have a clear directional preference at this time but demonstrates signs of neurodynamic involvement in both legs. Patient will benefit from skilled physical therapy intervention to address current body structure impairments and activity limitations to improve function and work towards goals set in current POC in order to return to prior level of function or maximal functional improvement.    Personal Factors and Comorbidities  Age;Fitness;Finances;Past/Current Experience;Profession;Comorbidity 3+    Comorbidities  shoulder pain, acid reflux, anxiety/depression    Examination-Activity Limitations  Bed Mobility;Lift;Squat;Bend;Stand;Caring for Others;Toileting;Carry;Transfers;Sit;Dressing;Sleep;Other   difficulty sleeping, bending, working, sitting, standing, lifting, pushing, pulling, with quality of life and usual ADLs, IADLs, community participation, and work   Engineering geologistxamination-Participation Restrictions  Laundry;Other;Interpersonal Relationship;Community Activity   work activities, exercise and fitness routine   Stability/Clinical Decision Making  Evolving/Moderate complexity    Clinical Decision Making  Moderate    Rehab Potential  Good    PT Frequency  2x / week    PT Duration  6 weeks    PT Treatment/Interventions  ADLs/Self Care Home Management;Cryotherapy;Moist Heat;Therapeutic activities;Therapeutic exercise;Neuromuscular re-education;Patient/family education;Manual techniques;Passive range of motion;Dry needling;Spinal Manipulations;Joint Manipulations;Other (comment)   joint mobilizations grades I-IV   PT Next Visit Plan  continue with core stablization and endurance exercises    PT Home Exercise Plan  Medbridge Access Code: [email or web address]       Patient will benefit from skilled therapeutic intervention in order to improve the following deficits and impairments:  Decreased endurance, Difficulty walking, Increased muscle spasms, Improper  body mechanics, Impaired perceived functional ability, Decreased range of motion, Decreased activity tolerance, Decreased coordination, Decreased strength, Hypermobility, Impaired flexibility, Increased fascial restricitons, Postural dysfunction, Pain  Visit Diagnosis: 1. Bilateral low back pain with bilateral sciatica, unspecified chronicity   2. Muscle weakness (generalized)   3. Paresthesia of skin   4. Other muscle spasm        Problem List Patient Active Problem List   Diagnosis Date Noted  . GAD (generalized anxiety disorder) 05/01/2017  . Anxiety 04/13/2017  . Panic attack 04/13/2017    Luretha MurphySara R. Ilsa IhaSnyder, PT, DPT 09/10/18, 8:04 PM  [facility] [address] [place], [place], [id number] Phone: [phone]   Fax:  [phone]  Name: Cardell Peachnna Nicole Bergemann MRN: [id number] Date of Birth: 1995/12/02

## 2018-09-13 ENCOUNTER — Ambulatory Visit: Payer: 59 | Admitting: Physical Therapy

## 2018-09-17 ENCOUNTER — Ambulatory Visit: Payer: 59 | Admitting: Physical Therapy

## 2018-09-17 ENCOUNTER — Encounter: Payer: Self-pay | Admitting: Physical Therapy

## 2018-09-17 DIAGNOSIS — R202 Paresthesia of skin: Secondary | ICD-10-CM

## 2018-09-17 DIAGNOSIS — M5441 Lumbago with sciatica, right side: Secondary | ICD-10-CM

## 2018-09-17 DIAGNOSIS — M62838 Other muscle spasm: Secondary | ICD-10-CM

## 2018-09-17 DIAGNOSIS — M6281 Muscle weakness (generalized): Secondary | ICD-10-CM

## 2018-09-17 DIAGNOSIS — M5442 Lumbago with sciatica, left side: Secondary | ICD-10-CM

## 2018-09-17 NOTE — Therapy (Signed)
[facility] [address], [place], [address] Phone: [phone]   Fax:  [phone]  Physical Therapy No-Visit Discharge Reporting Period: 09/10/2018 - 09/17/2018  Patient Details  Name: Clotiel Troop MRN: [id number] Date of Birth: [birth year] Referring Provider (PT): Donnamarie Rossetti, [place]   Encounter Date: 09/17/2018    Past Medical History:  Diagnosis Date  . Anxiety   . Costochondritis   . Hx of migraine headaches    common migraines    Past Surgical History:  Procedure Laterality Date  . MYRINGOTOMY    . WISDOM TOOTH EXTRACTION      There were no vitals filed for this visit.  Subjective Assessment - 09/17/18 1504    Subjective  Following initial eval, patient called and spoke to front office staff and requested self-discharge without explanation.    Pertinent History  Patient is a 23 y.o. female who presents to outpatient physical therapy with a referral for medical diagnosis lower back pain with sciatica. This patient's chief complaints consist of acute on chronic low back pain with most recent flare in April including new bilateral leg symptoms, L > R, leading to the following functional deficits: difficulty sleeping, bending, working, sitting, standing, lifting, pushing, pulling, with quality of life and usual ADLs, IADLs, community participation, and work. Relevant past medical history and comorbidities include acid reflux, B shoulder pain, anxiety/depresssion, anxiety attacks. Denies recent changes in bowel/bladder, increased stumbling, saddle paresthesia, unexplained weight loss, nausea, facial numbness.    Limitations  Sitting;House hold activities;Other (comment);Lifting;Standing;Walking    How long can you sit comfortably?  variable    How long can you stand comfortably?  variable    How long can you walk comfortably?  variable    Diagnostic tests  Recent lumbar radiographs showed no  abnormalities according to patient.    Patient Stated Goals  to return to PLOF without pain    Pain Onset  More than a month ago       OBJECTIVE:  Patient not present for exam. Please see latest documentation for latest objective data.     PT Short Term Goals - 09/17/18 1506      PT SHORT TERM GOAL #1   Title  Be independent with initial home exercise program for self-management of symptoms.    Baseline  Initial HEP provided at IE (09/10/2018);    Time  2    Period  Weeks    Status  Unable to assess    Target Date  09/24/18        PT Long Term Goals - 09/17/18 1506      PT LONG TERM GOAL #1   Title  Be independent with a long-term home exercise program for self-management of symptoms.    Baseline  Initial HEP provided at IE (09/10/2018);    Time  6    Period  Weeks    Status  Not Met    Target Date  10/22/18      PT LONG TERM GOAL #2   Title  Reduce pain with functional activities to equal or less than 1/10 to allow patient to complete usual activities including ADLs, IADLs, and social engagement with less difficulty.    Baseline  8/10 (09/10/2018);    Time  6    Period  Weeks    Status  Not Met    Target Date  10/22/18      PT LONG TERM GOAL #3  Title  Demonstrate improved FOTO score by 10 units to demonstrate improvement in overall condition and self-reported functional ability.    Baseline  FOTO = 57 (09/10/2018);    Time  6    Period  Weeks    Status  Not Met    Target Date  10/22/18      PT LONG TERM GOAL #4   Title  Complete community, work and/or recreational activities without limitation due to current condition.    Baseline  difficulty sleeping, bending, working, sitting, standing, lifting, pushing, pulling, with quality of life and usual ADLs, IADLs, community participation, and work.    Time  6    Period  Weeks    Status  Not Met    Target Date  10/22/18      PT LONG TERM GOAL #5   Title  Patient will demonstrate ablility to hold curl up for 30 seconds  to demonstrate improved abdominal muscular endurance to improve support for lumbar spine during functional activities.    Baseline  5 seconds with only head lifted off mat (09/10/2018);    Time  6    Period  Weeks    Status  Not Met    Target Date  10/22/18       Plan - 09/17/18 1509    Clinical Impression Statement  Patient has attended skilled physical therapy initial evaluation and treatment session only this episode of care and has been unable to make progress towards goals due to lack of participation in [place]. She requested self-discharge without explanation in the days following her initial evaluation. Patient did have finacial concerns that she discussed with clinician at first session. Patient is now discharged from physical therapy due to self-discharge.    Personal Factors and Comorbidities  Age;Fitness;Finances;Past/Current Experience;Profession;Comorbidity 3+    Comorbidities  shoulder pain, acid reflux, anxiety/depression    Examination-Activity Limitations  Bed Mobility;Lift;Squat;Bend;Stand;Caring for Others;Toileting;Carry;Transfers;Sit;Dressing;Sleep;Other   difficulty sleeping, bending, working, sitting, standing, lifting, pushing, pulling, with quality of life and usual ADLs, IADLs, community participation, and work   Veterinary surgeon;Other;Interpersonal Relationship;Community Activity   work activities, exercise and fitness routine   Stability/Clinical Decision Making  Evolving/Moderate complexity    Rehab Potential  Good    PT Frequency  2x / week    PT Duration  6 weeks    PT Treatment/Interventions  ADLs/Self Care Home Management;Cryotherapy;Moist Heat;Therapeutic activities;Therapeutic exercise;Neuromuscular re-education;Patient/family education;Manual techniques;Passive range of motion;Dry needling;Spinal Manipulations;Joint Manipulations;Other (comment)   joint mobilizations grades I-IV   PT Next Visit Plan  Patient is now discharged from  physical therapy due to self-discharge.    PT Home Exercise Plan  Medbridge Access Code: [email or web address]       Patient will benefit from skilled therapeutic intervention in order to improve the following deficits and impairments:  Decreased endurance, Difficulty walking, Increased muscle spasms, Improper body mechanics, Impaired perceived functional ability, Decreased range of motion, Decreased activity tolerance, Decreased coordination, Decreased strength, Hypermobility, Impaired flexibility, Increased fascial restricitons, Postural dysfunction, Pain  Visit Diagnosis: 1. Bilateral low back pain with bilateral sciatica, unspecified chronicity   2. Muscle weakness (generalized)   3. Paresthesia of skin   4. Other muscle spasm        Problem List Patient Active Problem List   Diagnosis Date Noted  . GAD (generalized anxiety disorder) 05/01/2017  . Anxiety 04/13/2017  . Panic attack 04/13/2017    Everlean Alstrom. Graylon Good, PT, DPT 09/17/18, 3:09 PM  [facility] [address] [place], [address] Phone: [phone]   Fax:  [phone]  Name: Kaysha Parsell MRN: [id number] Date of Birth: 1995-07-31

## 2018-09-20 ENCOUNTER — Ambulatory Visit: Payer: 59 | Admitting: Physical Therapy

## 2018-09-24 ENCOUNTER — Encounter: Payer: 59 | Admitting: Physical Therapy

## 2018-09-26 ENCOUNTER — Encounter: Payer: 59 | Admitting: Physical Therapy

## 2018-09-29 ENCOUNTER — Other Ambulatory Visit: Payer: Self-pay

## 2018-09-29 ENCOUNTER — Emergency Department: Payer: 59

## 2018-09-29 ENCOUNTER — Emergency Department
Admission: EM | Admit: 2018-09-29 | Discharge: 2018-09-29 | Disposition: A | Discharge status: Home / Self Care | Payer: 59 | Attending: Emergency Medicine | Admitting: Emergency Medicine

## 2018-09-29 DIAGNOSIS — S199XXA Unspecified injury of neck, initial encounter: Secondary | ICD-10-CM | POA: Diagnosis not present

## 2018-09-29 DIAGNOSIS — R51 Headache: Secondary | ICD-10-CM | POA: Diagnosis present

## 2018-09-29 DIAGNOSIS — Y998 Other external cause status: Secondary | ICD-10-CM | POA: Insufficient documentation

## 2018-09-29 DIAGNOSIS — M542 Cervicalgia: Secondary | ICD-10-CM | POA: Diagnosis not present

## 2018-09-29 DIAGNOSIS — G44311 Acute post-traumatic headache, intractable: Secondary | ICD-10-CM | POA: Insufficient documentation

## 2018-09-29 DIAGNOSIS — Y9389 Activity, other specified: Secondary | ICD-10-CM | POA: Insufficient documentation

## 2018-09-29 DIAGNOSIS — Z79899 Other long term (current) drug therapy: Secondary | ICD-10-CM | POA: Insufficient documentation

## 2018-09-29 DIAGNOSIS — F17210 Nicotine dependence, cigarettes, uncomplicated: Secondary | ICD-10-CM | POA: Insufficient documentation

## 2018-09-29 DIAGNOSIS — Y92410 Unspecified street and highway as the place of occurrence of the external cause: Secondary | ICD-10-CM | POA: Diagnosis not present

## 2018-09-29 MED ORDER — IBUPROFEN 600 MG PO TABS: 600.0000 mg | ORAL_TABLET | Freq: Three times a day (TID) | ORAL | 0 refills | Status: DC | PRN

## 2018-09-29 MED ORDER — METHOCARBAMOL 500 MG PO TABS: 500.0000 mg | ORAL_TABLET | Freq: Three times a day (TID) | ORAL | 0 refills | Status: DC | PRN
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
## 2018-09-29 NOTE — ED Notes (Signed)
Pt verbalized understanding of discharge instructions. NAD at this time. 
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
## 2018-10-01 ENCOUNTER — Encounter: Payer: 59 | Admitting: Physical Therapy

## 2018-10-03 ENCOUNTER — Encounter: Payer: 59 | Admitting: Physical Therapy

## 2018-10-08 ENCOUNTER — Encounter: Payer: 59 | Admitting: Physical Therapy

## 2018-10-10 ENCOUNTER — Encounter: Payer: 59 | Admitting: Physical Therapy

## 2018-10-16 ENCOUNTER — Encounter: Payer: 59 | Admitting: Physical Therapy

## 2018-10-22 ENCOUNTER — Encounter: Payer: 59 | Admitting: Physical Therapy

## 2018-10-22 ENCOUNTER — Other Ambulatory Visit: Payer: Self-pay | Admitting: Certified Nurse Midwife

## 2018-10-24 ENCOUNTER — Encounter: Payer: 59 | Admitting: Physical Therapy

## 2018-12-31 DIAGNOSIS — F3340 Major depressive disorder, recurrent, in remission, unspecified: Secondary | ICD-10-CM | POA: Diagnosis not present

## 2018-12-31 DIAGNOSIS — R7303 Prediabetes: Secondary | ICD-10-CM | POA: Diagnosis not present

## 2018-12-31 DIAGNOSIS — F411 Generalized anxiety disorder: Secondary | ICD-10-CM | POA: Diagnosis not present

## 2019-01-10 DIAGNOSIS — M549 Dorsalgia, unspecified: Secondary | ICD-10-CM | POA: Diagnosis not present

## 2019-01-10 DIAGNOSIS — F419 Anxiety disorder, unspecified: Secondary | ICD-10-CM | POA: Diagnosis not present

## 2019-01-10 DIAGNOSIS — M25541 Pain in joints of right hand: Secondary | ICD-10-CM | POA: Diagnosis not present

## 2019-01-10 DIAGNOSIS — M79672 Pain in left foot: Secondary | ICD-10-CM | POA: Diagnosis not present

## 2019-01-10 DIAGNOSIS — M25542 Pain in joints of left hand: Secondary | ICD-10-CM | POA: Diagnosis not present

## 2019-01-10 DIAGNOSIS — F410 Panic disorder [episodic paroxysmal anxiety] without agoraphobia: Secondary | ICD-10-CM | POA: Diagnosis not present

## 2019-01-10 DIAGNOSIS — M79671 Pain in right foot: Secondary | ICD-10-CM | POA: Diagnosis not present

## 2019-01-10 DIAGNOSIS — E538 Deficiency of other specified B group vitamins: Secondary | ICD-10-CM | POA: Diagnosis not present

## 2019-01-21 ENCOUNTER — Other Ambulatory Visit: Payer: Self-pay

## 2019-01-21 ENCOUNTER — Ambulatory Visit (INDEPENDENT_AMBULATORY_CARE_PROVIDER_SITE_OTHER): Payer: 59 | Admitting: Vascular Surgery

## 2019-01-21 DIAGNOSIS — I7300 Raynaud's syndrome without gangrene: Secondary | ICD-10-CM

## 2019-01-21 DIAGNOSIS — M1990 Unspecified osteoarthritis, unspecified site: Secondary | ICD-10-CM

## 2019-01-21 DIAGNOSIS — K219 Gastro-esophageal reflux disease without esophagitis: Secondary | ICD-10-CM

## 2019-01-21 NOTE — Progress Notes (Signed)
MRN : [id number]  Rachel Hebert is a 23 y.o. (1995/08/02) female who presents with chief complaint of pain in both feet.  History of Present Illness:   Chief complaint: pain in both feet  Location: occurs in both feet and sometime the hands Character/quality of the symptom:  She notes her feet turn pale and seem cold also sometimes the hands, no blue color noted but some hyperemia noted Severity:  Moderate/y painful Duration: lasts for a relatively short time Timing/onset:  intermittent Aggravating/context:  None identified Relieving/modifying:  Warming  Not a regular smoker  No outpatient medications have been marked as taking for the 01/21/19 encounter (Appointment) with Gilda Crease, Latina Craver, MD.    Past Medical History:  Diagnosis Date  . Anxiety   . Costochondritis   . Hx of migraine headaches    common migraines    Past Surgical History:  Procedure Laterality Date  . MYRINGOTOMY    . WISDOM TOOTH EXTRACTION      Social History Social History   Tobacco Use  . Smoking status: Current Some Day Smoker    Packs/day: 0.25    Types: Cigarettes  . Smokeless tobacco: Never Used  Substance Use Topics  . Alcohol use: Yes    Comment: Social   . Drug use: No    Family History Family History  Problem Relation Age of Onset  . Pancreatic cancer Maternal Grandmother   . Lung cancer Maternal Grandfather   . Ovarian cancer Paternal Grandmother     No Known Allergies   REVIEW OF SYSTEMS (Negative unless checked)  Constitutional: [] Weight loss  [] Fever  [] Chills Cardiac: [] Chest pain   [] Chest pressure   [] Palpitations   [] Shortness of breath when laying flat   [] Shortness of breath with exertion. Vascular:  [] Pain in legs with walking   [x] Pain in legs at rest  [] History of DVT   [] Phlebitis   [] Swelling in legs   [] Varicose veins   [] Non-healing ulcers Pulmonary:   [] Uses home oxygen   [] Productive cough   [] Hemoptysis   [] Wheeze  [] COPD    [] Asthma Neurologic:  [] Dizziness   [] Seizures   [] History of stroke   [] History of TIA  [] Aphasia   [] Vissual changes   [] Weakness or numbness in arm   [] Weakness or numbness in leg Musculoskeletal:   [] Joint swelling   [] Joint pain   [] Low back pain Hematologic:  [] Easy bruising  [] Easy bleeding   [] Hypercoagulable state   [] Anemic Gastrointestinal:  [] Diarrhea   [] Vomiting  [] Gastroesophageal reflux/heartburn   [] Difficulty swallowing. Genitourinary:  [] Chronic kidney disease   [] Difficult urination  [] Frequent urination   [] Blood in urine Skin:  [] Rashes   [] Ulcers  Psychological:  [] History of anxiety   []  History of major depression.  Physical Examination  There were no vitals filed for this visit. There is no height or weight on file to calculate BMI. Gen: WD/WN, NAD Head: [place]/AT, No temporalis wasting.  Ear/Nose/Throat: Hearing grossly intact, nares w/o erythema or drainage Eyes: PER, EOMI, sclera nonicteric.  Neck: Supple, no large masses.   Pulmonary:  Good air movement, no audible wheezing bilaterally, no use of accessory muscles.  Cardiac: RRR, no JVD Vascular:  Vessel Right Left  Radial Palpable Palpable  PT Palpable Palpable  DP Palpable Palpable  Gastrointestinal: Non-distended. No guarding/no peritoneal signs.  Musculoskeletal: M/S 5/5 throughout.  No deformity or atrophy.  Neurologic: CN 2-12 intact. Symmetrical.  Speech is fluent. Motor exam as listed above. Psychiatric: Judgment intact, Mood &  affect appropriate for pt's clinical situation. Dermatologic: No rashes or ulcers noted.  No changes consistent with cellulitis. Lymph : No lichenification or skin changes of chronic lymphedema.  CBC Lab Results  Component Value Date   WBC 10.8 04/22/2017   HGB 13.5 04/22/2017   HCT 39.9 04/22/2017   MCV 85.6 04/22/2017   PLT 380 04/22/2017    BMET    Component Value Date/Time   NA 138 04/22/2017 1916   K 3.9 04/22/2017 1916   CL 108 04/22/2017 1916   CO2 25  04/22/2017 1916   GLUCOSE 109 (H) 04/22/2017 1916   BUN 7 04/22/2017 1916   CREATININE 0.68 04/22/2017 1916   CALCIUM 9.5 04/22/2017 1916   GFRNONAA >60 04/22/2017 1916   GFRAA >60 04/22/2017 1916   CrCl cannot be calculated (Patient's most recent lab result is older than the maximum 21 days allowed.).  COAG No results found for: INR, PROTIME  Radiology No results found.   Assessment/Plan 1. Raynaud's phenomenon without gangrene Recommend:  The patient is currently tolerating the Raynaud's changes fairly well.  Perhaps there is as connection between this and the possibility of arthritis.  Lengthy discussion regarding keeping the feet and the hands warm; gloves, washing with only warm water (especially avoiding cold water immersion) and using wool socks, specifically Smartwool was recommended.  Possibility of using Norvasc was discussed but it was decided to hold off for now until the benefits of conservative therapy is assessed.  The patient will follow up PRN if the changes worsen or persist.    2. Gastroesophageal reflux disease without esophagitis Continue PPI as already ordered, this medication has been reviewed and there are no changes at this time.  Avoidence of caffeine and alcohol  Moderate elevation of the head of the bed   3. Arthritis Continue NSAID medications as already ordered, these medications have been reviewed and there are no changes at this time.  Continued activity and therapy was stressed.     Hortencia Pilar, MD  01/21/2019 1:15 PM

## 2019-02-11 ENCOUNTER — Encounter (INDEPENDENT_AMBULATORY_CARE_PROVIDER_SITE_OTHER): Payer: Self-pay | Admitting: Vascular Surgery

## 2019-02-11 DIAGNOSIS — M79672 Pain in left foot: Secondary | ICD-10-CM | POA: Diagnosis not present

## 2019-02-11 DIAGNOSIS — L539 Erythematous condition, unspecified: Secondary | ICD-10-CM | POA: Diagnosis not present

## 2019-02-11 DIAGNOSIS — M1990 Unspecified osteoarthritis, unspecified site: Secondary | ICD-10-CM | POA: Insufficient documentation

## 2019-02-11 DIAGNOSIS — G629 Polyneuropathy, unspecified: Secondary | ICD-10-CM | POA: Diagnosis not present

## 2019-02-11 DIAGNOSIS — M79671 Pain in right foot: Secondary | ICD-10-CM | POA: Diagnosis not present

## 2019-02-11 DIAGNOSIS — K219 Gastro-esophageal reflux disease without esophagitis: Secondary | ICD-10-CM | POA: Insufficient documentation

## 2019-02-11 DIAGNOSIS — I7300 Raynaud's syndrome without gangrene: Secondary | ICD-10-CM | POA: Insufficient documentation

## 2019-02-27 DIAGNOSIS — Z1152 Encounter for screening for COVID-19: Secondary | ICD-10-CM | POA: Diagnosis not present

## 2019-03-12 DIAGNOSIS — F330 Major depressive disorder, recurrent, mild: Secondary | ICD-10-CM | POA: Diagnosis not present

## 2019-03-15 DIAGNOSIS — I10 Essential (primary) hypertension: Secondary | ICD-10-CM | POA: Diagnosis not present

## 2019-03-15 DIAGNOSIS — F411 Generalized anxiety disorder: Secondary | ICD-10-CM | POA: Diagnosis not present

## 2019-03-15 DIAGNOSIS — F3340 Major depressive disorder, recurrent, in remission, unspecified: Secondary | ICD-10-CM | POA: Diagnosis not present

## 2019-03-15 DIAGNOSIS — E538 Deficiency of other specified B group vitamins: Secondary | ICD-10-CM | POA: Diagnosis not present

## 2019-03-26 DIAGNOSIS — F330 Major depressive disorder, recurrent, mild: Secondary | ICD-10-CM | POA: Diagnosis not present

## 2019-04-08 DIAGNOSIS — F330 Major depressive disorder, recurrent, mild: Secondary | ICD-10-CM | POA: Diagnosis not present

## 2019-04-26 ENCOUNTER — Ambulatory Visit: Payer: 59 | Admitting: Certified Nurse Midwife

## 2019-04-30 DIAGNOSIS — F330 Major depressive disorder, recurrent, mild: Secondary | ICD-10-CM | POA: Diagnosis not present

## 2019-05-20 DIAGNOSIS — F330 Major depressive disorder, recurrent, mild: Secondary | ICD-10-CM | POA: Diagnosis not present

## 2019-05-26 NOTE — Progress Notes (Deleted)
Gynecology Annual Exam  PCP: Barbette Reichmann, MD  Chief Complaint:  No chief complaint on file.   History of Present Illness: Rachel Hebert is a 24 y.o. female who presents for her annual gyn exam. 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  Since her last annual 04/13/17, she was started on Lexapro then switched to Wellbutrin for treatment of anxiety. She reports that the Wellbutrin has been more effective with decreasing her anxiety, but would like to try an increased dose. She is unsure when she can get back to see her PCP due to the COVID-19 restrictions and is requesting whether she could have that done through this appointment.  She is also interested in starting OCPs. Has never been on OCPs, but would be a good candidate to help with her dysmenorrhea and acne. Has no contraindications to OCPs.   Her menses are regular, they occur every month, and they last 2-3 days. Her flow is moderate. She does not have intermenstrual bleeding. Her last menstrual period was 03/27/2018. She has dysmenorrhea and uses a heating pad and more recently 800 mgm ibuprofen with relief.  Last pap smear: NA due to age.   The patient is not currently sexually active. She has used  condoms for contraception in the past.   Her past medical history is remarkable for anxiety, acne and common migraines.  The patient does not perform self breast exams. Her last mammogram was NA.   There is no family history of breast cancer. Mother had a recent breast biopsy and is now needing an excision of a abnormality.   There is a family history of ovarian cancer in her paternal grandmother. Genetic testing has not been done.  The patient denies smoking.  She does drink alcohol occasionally   She denies illegal drug use.  The patient reports exercising most days of the week.  The patient denies current symptoms of depression.    Review of Systems: Review of Systems    Constitutional: Negative for chills, fever and weight loss.       Positive for weight gain  HENT: Negative for congestion, sinus pain and sore throat.   Eyes: Positive for blurred vision (far vision is blurred). Negative for pain.  Respiratory: Negative for hemoptysis, shortness of breath and wheezing.   Cardiovascular: Negative for chest pain, palpitations and leg swelling.  Gastrointestinal: Positive for constipation (intermittent). Negative for abdominal pain, blood in stool, diarrhea, heartburn, nausea and vomiting.  Genitourinary: Negative for dysuria, frequency, hematuria and urgency.  Musculoskeletal: Negative for back pain, joint pain and myalgias.  Skin: Negative for itching and rash.  Neurological: Positive for dizziness (with panic attack), tingling (in right arm with most recent panic attack) and headaches.  Endo/Heme/Allergies: Negative for environmental allergies and polydipsia. Does not bruise/bleed easily.       Negative for hirsutism   Psychiatric/Behavioral: Negative for depression. The patient is nervous/anxious and has insomnia (sometimes).     Past Medical History:  Past Medical History:  Diagnosis Date  . Anxiety   . Costochondritis   . Hx of migraine headaches    common migraines    Past Surgical History:  Past Surgical History:  Procedure Laterality Date  . MYRINGOTOMY    . WISDOM TOOTH EXTRACTION      Family History:  Family History  Problem Relation Age of Onset  . Pancreatic cancer Maternal Grandmother   . Lung cancer Maternal Grandfather   .  Ovarian cancer Paternal Grandmother     Social History:  Social History   Socioeconomic History  . Marital status: Single    Spouse name: Not on file  . Number of children: 0  . Years of education: Not on file  . Highest education level: Not on file  Occupational History  . 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 Smoking status: Current Some Day Smoker    Packs/day: 0.25    Types:  Cigarettes  . Smokeless tobacco: Never Used  Substance and Sexual Activity  . Alcohol use: Yes    Comment: Social   . Drug use: No  . Sexual activity: Not Currently    Birth control/protection: Pill  Other Topics Concern  . Not on file  Social History Narrative  . Not on file   Social Determinants of Health   Financial Resource Strain:   . Difficulty of Paying Living Expenses:   Food Insecurity:   . Worried About Charity fundraiser in the Last Year:   . Arboriculturist in the Last Year:   Transportation Needs:   . Film/video editor (Medical):   Marland Kitchen Lack of Transportation (Non-Medical):   Physical Activity:   . Days of Exercise per Week:   . Minutes of Exercise per Session:   Stress:   . Feeling of Stress :   Social Connections:   . Frequency of Communication with Friends and Family:   . Frequency of Social Gatherings with Friends and Family:   . Attends Religious Services:   . Active Member of Clubs or Organizations:   . Attends Archivist Meetings:   Marland Kitchen Marital Status:   Intimate Partner Violence:   . Fear of Current or Ex-Partner:   . Emotionally Abused:   Marland Kitchen Physically Abused:   . Sexually Abused:     Allergies:  No Known Allergies  Medications: Wellbutrin 150 mgm XL daily Current Outpatient Medications on File Prior to Visit  Medication Sig Dispense Refill  . buPROPion (WELLBUTRIN XL) 300 MG 24 hr tablet TAKE 1 TABLET (300 MG TOTAL) BY MOUTH DAILY. 30 tablet 2  . ergocalciferol (VITAMIN D2) 1.25 MG (50000 UT) capsule Take by mouth.    Marland Kitchen ibuprofen (ADVIL) 600 MG tablet Take 1 tablet (600 mg total) by mouth every 8 (eight) hours as needed. 20 tablet 0  . Levonorgestrel-Ethinyl Estradiol (AMETHIA) 0.15-0.03 &0.01 MG tablet Take 1 tablet by mouth daily. 1 Package 4  . MELOXICAM PO Take by mouth.    . methocarbamol (ROBAXIN) 500 MG tablet Take 1 tablet (500 mg total) by mouth every 8 (eight) hours as needed for muscle spasms. (Patient not taking:  Reported on 01/21/2019) 15 tablet 0  . omeprazole (PRILOSEC) 20 MG capsule Take 20 mg by mouth as needed.     . predniSONE (DELTASONE) 20 MG tablet Take 3 tabs po qd x 3 days, then 2 tabs po qd x 4 days (Patient not taking: Reported on 09/10/2018) 17 tablet 0  . vitamin B-12 (CYANOCOBALAMIN) 1000 MCG tablet Take 1,000 mcg by mouth daily.     No current facility-administered medications on file prior to visit.  Melatonin prn   Physical Exam Vitals: There were no vitals taken for this visit.  General: WF in  NAD HEENT: normocephalic, anicteric Neck: no thyroid enlargement, no palpable nodules, no cervical lymphadenopathy  Pulmonary: No increased work of breathing, CTAB Cardiovascular: RRR, without murmur  Breast: Breast symmetrical, no tenderness, no palpable nodules or  masses, no skin or nipple retraction present, no nipple discharge.  No axillary, infraclavicular or supraclavicular lymphadenopathy. Abdomen: Soft, non-tender, non-distended.  Umbilicus pierced.  No hepatomegaly or masses palpable. No evidence of hernia. Genitourinary:  External: Normal external female genitalia.  Normal urethral meatus, normal Bartholin's and Skene's glands.    Vagina: Normal vaginal mucosa, no evidence of prolapse.    Cervix: no bleeding, non-tender, no lesions  Uterus: Anteverted, NSSC, deviated to the left, mobile, and non-tender  Adnexa: No adnexal masses, non-tender  Rectal: deferred  Lymphatic: no evidence of inguinal lymphadenopathy Extremities: no edema, erythema, or tenderness Neurologic: Grossly intact Psychiatric: mood appropriate, affect full     Assessment: 24 y.o. annual gyn exam  Anxiety-improved on Wellbutrin 150 mgm XL. DIscussed trial of Wellbutrin 300 mgm XL daily (to take in AM) vs adding anxiolytic like Buspar. She would prefer trial of increased Wellbutrin dose.  Contraceptive management  Family history of ovarian cancer   Plan:   1) Breast cancer screening - recommend  monthly self breast exam.  2) STI testing not indicated  3) Cervical cancer screening -Pap done  4) Contraception - Interested in starting [place]. We discussed benefits, risks, possible side effects, MOA, how to take and what to do for missed pills. Discussed thromboembolic risks and warning signs. Sample pack and RX for [place]. RTO for follow up in 2-3 months.  Offered MYRISK testing. Will await pathology on mother's breast biopsy.  Farrel Conners, CNM

## 2019-05-27 ENCOUNTER — Ambulatory Visit: Payer: 59 | Admitting: Certified Nurse Midwife

## 2019-06-04 DIAGNOSIS — E538 Deficiency of other specified B group vitamins: Secondary | ICD-10-CM | POA: Diagnosis not present

## 2019-06-04 DIAGNOSIS — F3340 Major depressive disorder, recurrent, in remission, unspecified: Secondary | ICD-10-CM | POA: Diagnosis not present

## 2019-06-04 DIAGNOSIS — F411 Generalized anxiety disorder: Secondary | ICD-10-CM | POA: Diagnosis not present

## 2019-06-04 DIAGNOSIS — I10 Essential (primary) hypertension: Secondary | ICD-10-CM | POA: Diagnosis not present

## 2019-06-04 DIAGNOSIS — R7989 Other specified abnormal findings of blood chemistry: Secondary | ICD-10-CM | POA: Diagnosis not present

## 2019-06-06 DIAGNOSIS — R8290 Unspecified abnormal findings in urine: Secondary | ICD-10-CM | POA: Diagnosis not present

## 2019-07-09 ENCOUNTER — Ambulatory Visit: Payer: 59 | Admitting: Certified Nurse Midwife

## 2019-07-16 ENCOUNTER — Other Ambulatory Visit: Payer: Self-pay | Admitting: Certified Nurse Midwife

## 2019-07-17 DIAGNOSIS — R7989 Other specified abnormal findings of blood chemistry: Secondary | ICD-10-CM | POA: Diagnosis not present

## 2019-07-17 DIAGNOSIS — F909 Attention-deficit hyperactivity disorder, unspecified type: Secondary | ICD-10-CM | POA: Diagnosis not present

## 2019-07-17 DIAGNOSIS — E538 Deficiency of other specified B group vitamins: Secondary | ICD-10-CM | POA: Diagnosis not present

## 2019-07-17 DIAGNOSIS — F419 Anxiety disorder, unspecified: Secondary | ICD-10-CM | POA: Diagnosis not present

## 2019-07-21 NOTE — Progress Notes (Signed)
Gynecology Annual Exam  PCP: Barbette Reichmann, MD  Chief Complaint:  Chief Complaint  Patient presents with  . Gynecologic Exam    no complaints    History of Present Illness: Rachel Hebert is a 24 y.o. female who presents for her annual gyn exam. She moved from [place], [place] two to three years ago and works at same day surgery.  At her last annual 04/25/2018, she started OCPs to help with her dysmenorrhea. Currently she is using an extended cycle OCP.  Her menses are regular, they occur every three months, and they last 7 days. Her flow is light. She does not have intermenstrual bleeding. Her last menstrual period was last week.  Last pap smear: 04/25/2018 NIL.  The patient is not currently sexually active.  Has been sexually active since hr last visit and requests STI screening.  Her past medical history is remarkable for anxiety/ depression (taking WELLBUTRIN), acne and common migraines. She was just recently diagnosed with low vitamin B12, low vitamin D and is taking supplements. She was recently started on Adderal for trouble concentrating and completing tasks at work.   The patient does perform self breast exams. Her last mammogram was NA.   There is no family history of breast cancer.   There is a family history of ovarian cancer in her paternal grandmother. Genetic testing has not been done.  The patient denies smoking.  She does drink alcohol occasionally  She denies illegal drug use.  The patient reports not exercising regularly during Covid   Review of Systems: Review of Systems  Constitutional: Negative for chills, fever and weight loss.       Positive for weight gain  HENT: Negative for congestion, sinus pain and sore throat.   Eyes: Negative for blurred vision and pain.  Respiratory: Negative for hemoptysis, shortness of breath and wheezing.   Cardiovascular: Negative for chest pain, palpitations and leg swelling.  Gastrointestinal:  Negative for abdominal pain, blood in stool, diarrhea, heartburn, nausea and vomiting.  Genitourinary: Negative for dysuria, frequency, hematuria and urgency.  Musculoskeletal: Negative for back pain, joint pain and myalgias.  Skin: Negative for itching and rash.  Neurological: Negative for dizziness and headaches.  Endo/Heme/Allergies: Negative for environmental allergies and polydipsia. Does not bruise/bleed easily.       Negative for hirsutism   Psychiatric/Behavioral: Negative for depression. The patient is nervous/anxious and has insomnia (sometimes).     Past Medical History:  Past Medical History:  Diagnosis Date  . ADHD   . Anxiety   . Costochondritis   . Hx of migraine headaches    common migraines    Past Surgical History:  Past Surgical History:  Procedure Laterality Date  . MYRINGOTOMY    . WISDOM TOOTH EXTRACTION      Family History:  Family History  Problem Relation Age of Onset  . Pancreatic cancer Maternal Grandmother   . Lung cancer Maternal Grandfather   . Ovarian cancer Paternal Grandmother     Social History:  Social History   Socioeconomic History  . Marital status: Single    Spouse name: Not on file  . Number of children: 0  . Years of education: Not on file  . Highest education level: Not on file  Occupational History  . 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 Smoking status: Former Smoker    Packs/day: 0.25    Types: Cigarettes    Start date: 04/08/2019  . Smokeless tobacco:  Never Used  Vaping Use  . Vaping Use: Never used  Substance and Sexual Activity  . Alcohol use: Yes    Comment: Social   . Drug use: No  . Sexual activity: Not Currently    Birth control/protection: Pill  Other Topics Concern  . Not on file  Social History Narrative  . Not on file   Social Determinants of Health   Financial Resource Strain:   . Difficulty of Paying Living Expenses:   Food Insecurity:   . Worried About Charity fundraiser in the  Last Year:   . Arboriculturist in the Last Year:   Transportation Needs:   . Film/video editor (Medical):   Marland Kitchen Lack of Transportation (Non-Medical):   Physical Activity:   . Days of Exercise per Week:   . Minutes of Exercise per Session:   Stress:   . Feeling of Stress :   Social Connections:   . Frequency of Communication with Friends and Family:   . Frequency of Social Gatherings with Friends and Family:   . Attends Religious Services:   . Active Member of Clubs or Organizations:   . Attends Archivist Meetings:   Marland Kitchen Marital Status:   Intimate Partner Violence:   . Fear of Current or Ex-Partner:   . Emotionally Abused:   Marland Kitchen Physically Abused:   . Sexually Abused:     Allergies:  No Known Allergies  Medications:  Current Outpatient Medications:  .  buPROPion (WELLBUTRIN XL) 300 MG 24 hr tablet, TAKE 1 TABLET (300 MG TOTAL) BY MOUTH DAILY., Disp: 30 tablet, Rfl: 2 .  ergocalciferol (VITAMIN D2) 1.25 MG (50000 UT) capsule, Take by mouth., Disp: , Rfl:  .  ibuprofen (ADVIL) 600 MG tablet, Take 1 tablet (600 mg total) by mouth every 8 (eight) hours as needed., Disp: 20 tablet, Rfl: 0 .  SIMPESSE 0.15-0.03 &0.01 MG tablet, TAKE 1 TABLET BY MOUTH DAILY., Disp: 91 tablet, Rfl: 0 .  vitamin B-12 (CYANOCOBALAMIN) 1000 MCG tablet, Take 1,000 mcg by mouth daily., Disp: , Rfl:  .  omeprazole (PRILOSEC) 20 MG capsule, Take 20 mg by mouth as needed. , Disp: , Rfl:    Physical Exam Vitals: BP 126/68 (BP Location: Right Arm, Patient Position: Sitting, Cuff Size: Normal)   Pulse (!) 106   Ht 4\' 11"  (1.499 m)   Wt 142 lb (64.4 kg)   LMP 07/15/2019   BMI 28.68 kg/m   General: WF in  NAD HEENT: normocephalic, anicteric Neck: no thyroid enlargement, no palpable nodules, no cervical lymphadenopathy  Pulmonary: No increased work of breathing, CTAB Cardiovascular: RRR, without murmur  Breast: Breast symmetrical, no tenderness, no palpable nodules or masses, no skin or nipple  retraction present, no nipple discharge.  No axillary, infraclavicular or supraclavicular lymphadenopathy. Abdomen: Soft, non-tender, non-distended.  Umbilicus pierced.  No hepatomegaly or masses palpable. No evidence of hernia. Genitourinary:  External: Normal external female genitalia.  Normal urethral meatus, normal Bartholin's and Skene's glands.    Vagina: Normal vaginal mucosa, no evidence of prolapse.    Cervix: no bleeding, non-tender, no lesions  Uterus: MP, NSSC, deviated to the left, mobile, and non-tender  Adnexa: No adnexal masses, non-tender  Rectal: deferred  Lymphatic: no evidence of inguinal lymphadenopathy Extremities: no edema, erythema, or tenderness Neurologic: Grossly intact Psychiatric: mood appropriate, affect full     Assessment: 24 y.o. annual gyn exam.  Contraceptive management  Family history of ovarian cancer   Plan:  1) Breast cancer screening - recommend monthly self breast exam.  2) STI testing requested  3) Cervical cancer screening -Pap done  4) Contraception / treatment of dysmenorrhea  5) Offered MYRISK testing last year. Forgot to offer this year-to be addressed at her next annual.  Farrel Conners, CNM

## 2019-07-22 ENCOUNTER — Ambulatory Visit (INDEPENDENT_AMBULATORY_CARE_PROVIDER_SITE_OTHER): Payer: 59 | Admitting: Certified Nurse Midwife

## 2019-07-22 ENCOUNTER — Encounter: Payer: Self-pay | Admitting: Certified Nurse Midwife

## 2019-07-22 ENCOUNTER — Other Ambulatory Visit: Payer: Self-pay

## 2019-07-22 ENCOUNTER — Other Ambulatory Visit (HOSPITAL_COMMUNITY)
Admission: RE | Admit: 2019-07-22 | Discharge: 2019-07-22 | Disposition: A | Discharge status: Home / Self Care | Payer: 59 | Source: Ambulatory Visit | Attending: Certified Nurse Midwife | Admitting: Certified Nurse Midwife

## 2019-07-22 VITALS — BP 126/68 | HR 106 | Ht 59.0 in | Wt 142.0 lb

## 2019-07-22 DIAGNOSIS — Z01419 Encounter for gynecological examination (general) (routine) without abnormal findings: Secondary | ICD-10-CM | POA: Diagnosis not present

## 2019-07-22 DIAGNOSIS — Z124 Encounter for screening for malignant neoplasm of cervix: Secondary | ICD-10-CM | POA: Insufficient documentation

## 2019-07-22 DIAGNOSIS — Z113 Encounter for screening for infections with a predominantly sexual mode of transmission: Secondary | ICD-10-CM

## 2019-07-22 DIAGNOSIS — Z3041 Encounter for surveillance of contraceptive pills: Secondary | ICD-10-CM

## 2019-07-25 ENCOUNTER — Encounter: Payer: Self-pay | Admitting: Certified Nurse Midwife

## 2019-07-25 LAB — CYTOLOGY - PAP
Chlamydia: NEGATIVE
Comment: NEGATIVE
Comment: NORMAL
Diagnosis: NEGATIVE
Neisseria Gonorrhea: NEGATIVE

## 2019-08-14 ENCOUNTER — Ambulatory Visit
Admission: EM | Admit: 2019-08-14 | Discharge: 2019-08-14 | Disposition: A | Discharge status: Home / Self Care | Payer: 59 | Attending: Emergency Medicine | Admitting: Emergency Medicine

## 2019-08-14 DIAGNOSIS — J069 Acute upper respiratory infection, unspecified: Secondary | ICD-10-CM

## 2019-08-14 DIAGNOSIS — Z7689 Persons encountering health services in other specified circumstances: Secondary | ICD-10-CM | POA: Diagnosis not present

## 2019-08-14 LAB — POC SARS CORONAVIRUS 2 AG -  ED: SARS Coronavirus 2 Ag: NEGATIVE

## 2019-08-14 LAB — POCT RAPID STREP A (OFFICE): Rapid Strep A Screen: NEGATIVE

## 2019-08-14 MED ORDER — IBUPROFEN 600 MG PO TABS
600.0000 mg | ORAL_TABLET | Freq: Four times a day (QID) | ORAL | 0 refills | Status: DC | PRN
Start: 2019-08-14 — End: 2020-04-29

## 2019-08-14 MED ORDER — MUCINEX 600 MG PO TB12
1200.0000 mg | ORAL_TABLET | Freq: Two times a day (BID) | ORAL | 0 refills | Status: DC | PRN
Start: 2019-08-14 — End: 2020-04-29

## 2019-08-14 MED ORDER — BENZONATATE 100 MG PO CAPS
100.0000 mg | ORAL_CAPSULE | Freq: Three times a day (TID) | ORAL | 0 refills | Status: DC | PRN
Start: 2019-08-14 — End: 2020-02-06

## 2019-08-14 NOTE — ED Triage Notes (Signed)
C/o dry cough, nasal congestion, headache, and sore throat x3 days. 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 Reports she has not tried OTC meds or home remedies.

## 2019-08-14 NOTE — Discharge Instructions (Addendum)
Take the ibuprofen, Mucinex, and Tessalon Perles as directed.    Your rapid strep test is negative.  A throat culture is pending; we will call you if it is positive requiring treatment.    Your rapid COVID test is negative; the send-out test is pending.  You should self quarantine until your test result is back and is negative.    Go to the emergency department if you develop high fever, shortness of breath, severe diarrhea, or other concerning symptoms.

## 2019-08-14 NOTE — ED Provider Notes (Signed)
Renaldo Fiddler    CSN: [id number] Arrival date & time: 08/14/19  0998      History   Chief Complaint Chief Complaint  Patient presents with   Cough   Nasal Congestion   Headache    HPI Rachel Hebert is a 24 y.o. female.   Patient presents with 3 to 4-day history of sore throat.  She states she developed nasal congestion, runny nose, sinus pressure yesterday.  She also reports nonproductive cough.  She denies fever, chills, difficulty swallowing, shortness of breath, vomiting, diarrhea, rash, or other symptoms.  Treatment attempted at home with NyQuil and OTC allergy medication.  The history is provided by the patient.    Past Medical History:  Diagnosis Date   ADHD    Anxiety    Costochondritis    Hx of migraine headaches    common migraines    Patient Active Problem List   Diagnosis Date Noted   Attention deficit hyperactivity disorder (ADHD) 07/17/2019   Raynaud phenomenon 02/11/2019   GERD (gastroesophageal reflux disease) 02/11/2019   Arthritis 02/11/2019   GAD (generalized anxiety disorder) 05/01/2017   Anxiety 04/13/2017   Panic attack 04/13/2017    Past Surgical History:  Procedure Laterality Date   MYRINGOTOMY     WISDOM TOOTH EXTRACTION      OB History    Gravida  0   Para  0   Term  0   Preterm  0   AB  0   Living  0     SAB  0   TAB  0   Ectopic  0   Multiple  0   Live Births  0            Home Medications    Prior to Admission medications   Medication Sig Start Date End Date Taking? Authorizing Provider  benzonatate (TESSALON) 100 MG capsule Take 1 capsule (100 mg total) by mouth 3 (three) times daily as needed for cough. 08/14/19   Mickie Bail, NP  buPROPion (WELLBUTRIN XL) 300 MG 24 hr tablet TAKE 1 TABLET (300 MG TOTAL) BY MOUTH DAILY. 10/24/18   Farrel Conners, CNM  ergocalciferol (VITAMIN D2) 1.25 MG (50000 UT) capsule Take by mouth. 01/14/19   [provider]  guaiFENesin  (MUCINEX) 600 MG 12 hr tablet Take 2 tablets (1,200 mg total) by mouth 2 (two) times daily as needed. 08/14/19   Mickie Bail, NP  ibuprofen (ADVIL) 600 MG tablet Take 1 tablet (600 mg total) by mouth every 6 (six) hours as needed. 08/14/19   Mickie Bail, NP  omeprazole (PRILOSEC) 20 MG capsule Take 20 mg by mouth as needed.  09/01/17 01/21/19  [provider]  SIMPESSE 0.15-0.03 &0.01 MG tablet TAKE 1 TABLET BY MOUTH DAILY. 07/17/19   Farrel Conners, CNM  vitamin B-12 (CYANOCOBALAMIN) 1000 MCG tablet Take 1,000 mcg by mouth daily.    [provider]    Family History Family History  Problem Relation Age of Onset   Pancreatic cancer Maternal Grandmother    Lung cancer Maternal Grandfather    Ovarian cancer Paternal Grandmother     Social History Social History   Tobacco Use   Smoking status: Former Smoker    Packs/day: 0.25    Types: Cigarettes    Start date: 04/08/2019   Smokeless tobacco: Never Used  Vaping Use   Vaping Use: Never used  Substance Use Topics   Alcohol use: Yes    Comment: Social  Drug use: No     Allergies   Patient has no known allergies.   Review of Systems Review of Systems  Constitutional: Negative for chills and fever.  HENT: Positive for congestion, rhinorrhea, sinus pressure and sore throat. Negative for ear pain.   Eyes: Negative for pain and visual disturbance.  Respiratory: Positive for cough. Negative for shortness of breath.   Cardiovascular: Negative for chest pain and palpitations.  Gastrointestinal: Negative for abdominal pain, diarrhea, nausea and vomiting.  Genitourinary: Negative for dysuria and hematuria.  Musculoskeletal: Negative for arthralgias and back pain.  Skin: Negative for color change and rash.  Neurological: Negative for seizures and syncope.  All other systems reviewed and are negative.    Physical Exam Triage Vital Signs ED Triage Vitals  Enc Vitals Group     BP 08/14/19 0832 (!)  150/97     Pulse Rate 08/14/19 0832 (!) 106     Resp 08/14/19 0832 16     Temp 08/14/19 0832 98.8 F (37.1 C)     Temp src --      SpO2 08/14/19 0832 98 %     Weight --      Height --      Head Circumference --      Peak Flow --      Pain Score 08/14/19 0831 8     Pain Loc --      Pain Edu? --      Excl. in GC? --    No data found.  Updated Vital Signs BP (!) 150/97    Pulse (!) 106    Temp 98.8 F (37.1 C)    Resp 16    LMP 07/15/2019    SpO2 98%   Visual Acuity Right Eye Distance:   Left Eye Distance:   Bilateral Distance:    Right Eye Near:   Left Eye Near:    Bilateral Near:     Physical Exam Vitals and nursing note reviewed.  Constitutional:      General: She is not in acute distress.    Appearance: She is well-developed. She is not ill-appearing.  HENT:     Head: Normocephalic and atraumatic.     Right Ear: Tympanic membrane normal.     Left Ear: Tympanic membrane normal.     Nose: Congestion and rhinorrhea present.     Mouth/Throat:     Mouth: Mucous membranes are moist.     Pharynx: Posterior oropharyngeal erythema present. No oropharyngeal exudate.  Eyes:     Conjunctiva/sclera: Conjunctivae normal.  Cardiovascular:     Rate and Rhythm: Normal rate and regular rhythm.     Heart sounds: No murmur heard.   Pulmonary:     Effort: Pulmonary effort is normal. No respiratory distress.     Breath sounds: Normal breath sounds.  Abdominal:     Palpations: Abdomen is soft.     Tenderness: There is no abdominal tenderness. There is no guarding or rebound.  Musculoskeletal:     Cervical back: Neck supple.  Skin:    General: Skin is warm and dry.     Findings: No rash.  Neurological:     General: No focal deficit present.     Mental Status: She is alert and oriented to person, place, and time.     Gait: Gait normal.  Psychiatric:        Mood and Affect: Mood normal.        Behavior: Behavior normal.  UC Treatments / Results  Labs (all labs  ordered are listed, but only abnormal results are displayed) Labs Reviewed  CULTURE, GROUP A STREP ([facility])  NOVEL CORONAVIRUS, NAA  POCT RAPID STREP A (OFFICE)  POC SARS CORONAVIRUS 2 AG -  ED    EKG   Radiology No results found.  Procedures Procedures (including critical care time)  Medications Ordered in UC Medications - No data to display  Initial Impression / Assessment and Plan / UC Course  I have reviewed the triage vital signs and the nursing notes.  Pertinent labs & imaging results that were available during my care of the patient were reviewed by me and considered in my medical decision making (see chart for details).   Upper respiratory infection.  Treating with Mucinex, ibuprofen, Tessalon Perles.  Rapid strep negative; throat culture pending.  POC COVID negative; PCR pending.  Instructed patient to self quarantine until the test result is back.  Instructed patient to go to the emergency department if she develops high fever, shortness of breath, severe diarrhea, or other concerning symptoms.  Patient agrees with plan of care.    Final Clinical Impressions(s) / UC Diagnoses   Final diagnoses:  Upper respiratory tract infection, unspecified type     Discharge Instructions     Take the ibuprofen, Mucinex, and Tessalon Perles as directed.    Your rapid strep test is negative.  A throat culture is pending; we will call you if it is positive requiring treatment.    Your rapid COVID test is negative; the send-out test is pending.  You should self quarantine until your test result is back and is negative.    Go to the emergency department if you develop high fever, shortness of breath, severe diarrhea, or other concerning symptoms.        ED Prescriptions    Medication Sig Dispense Auth. Provider   ibuprofen (ADVIL) 600 MG tablet Take 1 tablet (600 mg total) by mouth every 6 (six) hours as needed. 30 tablet Mickie Bail, NP   guaiFENesin (MUCINEX) 600 MG 12 hr  tablet Take 2 tablets (1,200 mg total) by mouth 2 (two) times daily as needed. 12 tablet Mickie Bail, NP   benzonatate (TESSALON) 100 MG capsule Take 1 capsule (100 mg total) by mouth 3 (three) times daily as needed for cough. 21 capsule Mickie Bail, NP     PDMP not reviewed this encounter.   Mickie Bail, NP 08/14/19 [phone]

## 2019-08-15 LAB — NOVEL CORONAVIRUS, NAA: SARS-CoV-2, NAA: NOT DETECTED

## 2019-08-15 LAB — SARS-COV-2, NAA 2 DAY TAT

## 2019-08-16 LAB — CULTURE, GROUP A STREP (THRC)

## 2019-08-31 ENCOUNTER — Ambulatory Visit: Payer: 59 | Attending: Internal Medicine

## 2019-08-31 DIAGNOSIS — Z23 Encounter for immunization: Secondary | ICD-10-CM

## 2019-08-31 NOTE — Progress Notes (Signed)
   Covid-19 Vaccination Clinic  Name:  Danialle Dement    MRN: [id number] DOB: [birth year]  08/31/2019  Ms. Kratt was observed post Covid-19 immunization for 15 minutes without incident. She was provided with Vaccine Information Sheet and instruction to access the V-Safe system.   Ms. Goldwasser was instructed to call 911 with any severe reactions post vaccine: Marland Kitchen Difficulty breathing  . Swelling of face and throat  . A fast heartbeat  . A bad rash all over body  . Dizziness and weakness   Immunizations Administered    Name Date Dose VIS Date Route   Pfizer COVID-19 Vaccine 08/31/2019  8:27 AM 0.3 mL 04/03/2018 Intramuscular   Manufacturer: [employer], [employer]   Lot: [id number]   NDC: [address]-1

## 2019-09-23 ENCOUNTER — Ambulatory Visit: Payer: 59 | Attending: Internal Medicine

## 2019-09-23 ENCOUNTER — Ambulatory Visit: Payer: 59

## 2019-09-23 DIAGNOSIS — Z23 Encounter for immunization: Secondary | ICD-10-CM

## 2019-09-23 NOTE — Progress Notes (Signed)
   Covid-19 Vaccination Clinic  Name:  Gabriana Wilmott    MRN: [id number] DOB: 1995-05-17  09/23/2019  Ms. Buzzelli was observed post Covid-19 immunization for 15 minutes without incident. She was provided with Vaccine Information Sheet and instruction to access the V-Safe system.   Ms. Caison was instructed to call 911 with any severe reactions post vaccine: Marland Kitchen Difficulty breathing  . Swelling of face and throat  . A fast heartbeat  . A bad rash all over body  . Dizziness and weakness   Immunizations Administered    Name Date Dose VIS Date Route   Pfizer COVID-19 Vaccine 09/23/2019  3:01 PM 0.3 mL 04/03/2018 Intramuscular   Manufacturer: [employer], [employer]   Lot: [id number]   NDC: [address]-8

## 2019-10-17 ENCOUNTER — Telehealth: Payer: Self-pay

## 2019-10-17 MED ORDER — LEVONORGEST-ETH ESTRAD 91-DAY 0.15-0.03 &0.01 MG PO TABS: 1.0000 | ORAL_TABLET | Freq: Every day | ORAL | 2 refills | Status: DC

## 2019-10-17 NOTE — Telephone Encounter (Signed)
Pt calling; needs bc refill; pharm needs new rx since [facility] isn't here; would like to have [facility] as her provider; needs by next week.  [phone]

## 2019-10-18 ENCOUNTER — Other Ambulatory Visit: Payer: Self-pay | Admitting: Obstetrics

## 2019-10-18 NOTE — Telephone Encounter (Signed)
Left detailed msg refill sent in and pharm recv'd it.

## 2019-10-21 ENCOUNTER — Other Ambulatory Visit: Payer: Self-pay

## 2019-10-21 ENCOUNTER — Other Ambulatory Visit: Payer: Self-pay | Admitting: Obstetrics and Gynecology

## 2019-10-21 MED ORDER — LEVONORGEST-ETH ESTRAD 91-DAY 0.15-0.03 &0.01 MG PO TABS: 1.0000 | ORAL_TABLET | Freq: Every day | ORAL | 2 refills | Status: DC

## 2019-10-21 NOTE — Telephone Encounter (Signed)
Pt calling; rx was sent to [facility]; pt needs it changed to [facility] pharm.  Pt also needs help c MyChart.  [phone]  Pt aware rx sent to [facility] employee pharm; # given for help c MyChart.

## 2019-10-22 DIAGNOSIS — E538 Deficiency of other specified B group vitamins: Secondary | ICD-10-CM | POA: Diagnosis not present

## 2019-10-22 DIAGNOSIS — R7989 Other specified abnormal findings of blood chemistry: Secondary | ICD-10-CM | POA: Diagnosis not present

## 2019-10-22 DIAGNOSIS — F411 Generalized anxiety disorder: Secondary | ICD-10-CM | POA: Diagnosis not present

## 2019-10-22 DIAGNOSIS — M79671 Pain in right foot: Secondary | ICD-10-CM | POA: Diagnosis not present

## 2019-11-11 ENCOUNTER — Other Ambulatory Visit: Payer: Self-pay | Admitting: Podiatry

## 2019-11-11 DIAGNOSIS — M79672 Pain in left foot: Secondary | ICD-10-CM | POA: Diagnosis not present

## 2019-11-11 DIAGNOSIS — M2141 Flat foot [pes planus] (acquired), right foot: Secondary | ICD-10-CM | POA: Diagnosis not present

## 2019-11-11 DIAGNOSIS — M722 Plantar fascial fibromatosis: Secondary | ICD-10-CM | POA: Diagnosis not present

## 2019-11-11 DIAGNOSIS — M7661 Achilles tendinitis, right leg: Secondary | ICD-10-CM | POA: Diagnosis not present

## 2019-11-11 DIAGNOSIS — M79671 Pain in right foot: Secondary | ICD-10-CM | POA: Diagnosis not present

## 2019-11-21 ENCOUNTER — Other Ambulatory Visit: Payer: Self-pay | Admitting: Internal Medicine

## 2019-11-25 DIAGNOSIS — M2141 Flat foot [pes planus] (acquired), right foot: Secondary | ICD-10-CM | POA: Diagnosis not present

## 2019-11-25 DIAGNOSIS — M2142 Flat foot [pes planus] (acquired), left foot: Secondary | ICD-10-CM | POA: Diagnosis not present

## 2019-11-25 DIAGNOSIS — M79671 Pain in right foot: Secondary | ICD-10-CM | POA: Diagnosis not present

## 2019-11-25 DIAGNOSIS — M722 Plantar fascial fibromatosis: Secondary | ICD-10-CM | POA: Diagnosis not present

## 2019-11-25 DIAGNOSIS — M7662 Achilles tendinitis, left leg: Secondary | ICD-10-CM | POA: Diagnosis not present

## 2019-11-25 DIAGNOSIS — M7661 Achilles tendinitis, right leg: Secondary | ICD-10-CM | POA: Diagnosis not present

## 2019-12-10 DIAGNOSIS — H5213 Myopia, bilateral: Secondary | ICD-10-CM | POA: Diagnosis not present

## 2019-12-23 DIAGNOSIS — M79672 Pain in left foot: Secondary | ICD-10-CM | POA: Diagnosis not present

## 2019-12-23 DIAGNOSIS — M79671 Pain in right foot: Secondary | ICD-10-CM | POA: Diagnosis not present

## 2019-12-23 DIAGNOSIS — M6281 Muscle weakness (generalized): Secondary | ICD-10-CM | POA: Diagnosis not present

## 2019-12-23 DIAGNOSIS — M722 Plantar fascial fibromatosis: Secondary | ICD-10-CM | POA: Diagnosis not present

## 2020-01-07 ENCOUNTER — Other Ambulatory Visit: Payer: Self-pay | Admitting: Internal Medicine

## 2020-01-07 DIAGNOSIS — M722 Plantar fascial fibromatosis: Secondary | ICD-10-CM | POA: Diagnosis not present

## 2020-01-10 ENCOUNTER — Other Ambulatory Visit: Payer: Self-pay | Admitting: Podiatry

## 2020-01-14 DIAGNOSIS — R7989 Other specified abnormal findings of blood chemistry: Secondary | ICD-10-CM | POA: Diagnosis not present

## 2020-01-14 DIAGNOSIS — F909 Attention-deficit hyperactivity disorder, unspecified type: Secondary | ICD-10-CM | POA: Diagnosis not present

## 2020-01-14 DIAGNOSIS — Z131 Encounter for screening for diabetes mellitus: Secondary | ICD-10-CM | POA: Diagnosis not present

## 2020-01-14 DIAGNOSIS — N921 Excessive and frequent menstruation with irregular cycle: Secondary | ICD-10-CM | POA: Diagnosis not present

## 2020-01-14 DIAGNOSIS — F419 Anxiety disorder, unspecified: Secondary | ICD-10-CM | POA: Diagnosis not present

## 2020-01-14 DIAGNOSIS — E538 Deficiency of other specified B group vitamins: Secondary | ICD-10-CM | POA: Diagnosis not present

## 2020-01-20 DIAGNOSIS — M6281 Muscle weakness (generalized): Secondary | ICD-10-CM | POA: Diagnosis not present

## 2020-01-20 DIAGNOSIS — M79672 Pain in left foot: Secondary | ICD-10-CM | POA: Diagnosis not present

## 2020-01-20 DIAGNOSIS — M722 Plantar fascial fibromatosis: Secondary | ICD-10-CM | POA: Diagnosis not present

## 2020-01-20 DIAGNOSIS — M79671 Pain in right foot: Secondary | ICD-10-CM | POA: Diagnosis not present

## 2020-01-21 ENCOUNTER — Other Ambulatory Visit: Payer: Self-pay | Admitting: Internal Medicine

## 2020-01-21 DIAGNOSIS — F419 Anxiety disorder, unspecified: Secondary | ICD-10-CM | POA: Diagnosis not present

## 2020-01-21 DIAGNOSIS — M722 Plantar fascial fibromatosis: Secondary | ICD-10-CM | POA: Diagnosis not present

## 2020-01-21 DIAGNOSIS — F3341 Major depressive disorder, recurrent, in partial remission: Secondary | ICD-10-CM | POA: Diagnosis not present

## 2020-01-21 DIAGNOSIS — Z0000 Encounter for general adult medical examination without abnormal findings: Secondary | ICD-10-CM | POA: Diagnosis not present

## 2020-01-21 DIAGNOSIS — F909 Attention-deficit hyperactivity disorder, unspecified type: Secondary | ICD-10-CM | POA: Diagnosis not present

## 2020-01-24 ENCOUNTER — Other Ambulatory Visit: Payer: Self-pay | Admitting: Internal Medicine

## 2020-02-06 ENCOUNTER — Ambulatory Visit
Admission: EM | Admit: 2020-02-06 | Discharge: 2020-02-06 | Disposition: A | Discharge status: Home / Self Care | Payer: 59 | Attending: Family Medicine | Admitting: Family Medicine

## 2020-02-06 ENCOUNTER — Other Ambulatory Visit: Payer: Self-pay | Admitting: Family Medicine

## 2020-02-06 DIAGNOSIS — J029 Acute pharyngitis, unspecified: Secondary | ICD-10-CM | POA: Diagnosis not present

## 2020-02-06 DIAGNOSIS — H6690 Otitis media, unspecified, unspecified ear: Secondary | ICD-10-CM

## 2020-02-06 MED ORDER — AMOXICILLIN-POT CLAVULANATE 875-125 MG PO TABS
1.0000 | ORAL_TABLET | Freq: Two times a day (BID) | ORAL | 0 refills | Status: DC
Start: 2020-02-06 — End: 2020-04-09

## 2020-02-06 NOTE — ED Triage Notes (Signed)
Pt reports having nasal congestion, productive cough, sore throat and runny nose since 12/23. Neg covid test yesterday.

## 2020-02-06 NOTE — Discharge Instructions (Signed)
Treated for a sinus and bilateral ear infection.  Take the medication as prescribed. You can continue over-the-counter medicines as needed.  Recommend Flonase nasal spray for congestion.  Ibuprofen or Tylenol for pain as needed

## 2020-02-06 NOTE — ED Provider Notes (Signed)
Renaldo Fiddler    CSN: [id number] Arrival date & time: 02/06/20  1007      History   Chief Complaint Chief Complaint  Patient presents with   Nasal Congestion    HPI Rachel Hebert is a 24 y.o. female.   Patient is a 24 year old female who presents today with approximately 1 week or more of nasal congestion, productive cough, sore throat and bilateral ear pressure.  Pain is worse to the right ear.  Feels like she has fluid in her ears.  Took Covid test yesterday that was negative.  Taking over-the-counter medications without any relief.  No fevers, chills.     Past Medical History:  Diagnosis Date   ADHD    Anxiety    Costochondritis    Hx of migraine headaches    common migraines    Patient Active Problem List   Diagnosis Date Noted   Attention deficit hyperactivity disorder (ADHD) 07/17/2019   Raynaud phenomenon 02/11/2019   GERD (gastroesophageal reflux disease) 02/11/2019   Arthritis 02/11/2019   GAD (generalized anxiety disorder) 05/01/2017   Anxiety 04/13/2017   Panic attack 04/13/2017    Past Surgical History:  Procedure Laterality Date   MYRINGOTOMY     WISDOM TOOTH EXTRACTION      OB History    Gravida  0   Para  0   Term  0   Preterm  0   AB  0   Living  0     SAB  0   IAB  0   Ectopic  0   Multiple  0   Live Births  0            Home Medications    Prior to Admission medications   Medication Sig Start Date End Date Taking? Authorizing Provider  amoxicillin-clavulanate (AUGMENTIN) 875-125 MG tablet Take 1 tablet by mouth every 12 (twelve) hours. 02/06/20  Yes Braylee Lal A, NP  buPROPion (WELLBUTRIN XL) 300 MG 24 hr tablet TAKE 1 TABLET (300 MG TOTAL) BY MOUTH DAILY. 10/24/18   Farrel Conners, CNM  ergocalciferol (VITAMIN D2) 1.25 MG (50000 UT) capsule Take by mouth. 01/14/19   [provider]  guaiFENesin (MUCINEX) 600 MG 12 hr tablet Take 2 tablets (1,200 mg total) by mouth 2 (two)  times daily as needed. 08/14/19   Mickie Bail, NP  ibuprofen (ADVIL) 600 MG tablet Take 1 tablet (600 mg total) by mouth every 6 (six) hours as needed. 08/14/19   Mickie Bail, NP  Levonorgestrel-Ethinyl Estradiol (SIMPESSE) 0.15-0.03 &0.01 MG tablet Take 1 tablet by mouth daily. 10/21/19   Conard Novak, MD  omeprazole (PRILOSEC) 20 MG capsule Take 20 mg by mouth as needed.  09/01/17 01/21/19  [provider]  vitamin B-12 (CYANOCOBALAMIN) 1000 MCG tablet Take 1,000 mcg by mouth daily.    [provider]    Family History Family History  Problem Relation Age of Onset   Pancreatic cancer Maternal Grandmother    Lung cancer Maternal Grandfather    Ovarian cancer Paternal Grandmother     Social History Social History   Tobacco Use   Smoking status: Former Smoker    Packs/day: 0.25    Types: Cigarettes    Start date: 04/08/2019   Smokeless tobacco: Never Used  Vaping Use   Vaping Use: Never used  Substance Use Topics   Alcohol use: Yes    Comment: Social    Drug use: No     Allergies  Patient has no known allergies.   Review of Systems Review of Systems   Physical Exam Triage Vital Signs ED Triage Vitals [02/06/20 1145]  Enc Vitals Group     BP (!) 146/102     Pulse Rate 86     Resp 16     Temp 98.2 F (36.8 C)     Temp Source Oral     SpO2 97 %     Weight 147 lb (66.7 kg)     Height 4\' 11"  (1.499 m)     Head Circumference      Peak Flow      Pain Score 0     Pain Loc      Pain Edu?      Excl. in GC?    No data found.  Updated Vital Signs BP (!) 146/102    Pulse 86    Temp 98.2 F (36.8 C) (Oral)    Resp 16    Ht 4\' 11"  (1.499 m)    Wt 147 lb (66.7 kg)    SpO2 97%    BMI 29.69 kg/m   Visual Acuity Right Eye Distance:   Left Eye Distance:   Bilateral Distance:    Right Eye Near:   Left Eye Near:    Bilateral Near:     Physical Exam Vitals and nursing note reviewed.  Constitutional:      General: She is not in acute  distress.    Appearance: Normal appearance. She is not ill-appearing, toxic-appearing or diaphoretic.  HENT:     Head: Normocephalic.     Comments: Erythematous and retracted TM on right    Nose: Nose normal.  Eyes:     Conjunctiva/sclera: Conjunctivae normal.  Pulmonary:     Effort: Pulmonary effort is normal.  Musculoskeletal:        General: Normal range of motion.     Cervical back: Normal range of motion.  Skin:    General: Skin is warm and dry.     Findings: No rash.  Neurological:     Mental Status: She is alert.  Psychiatric:        Mood and Affect: Mood normal.      UC Treatments / Results  Labs (all labs ordered are listed, but only abnormal results are displayed) Labs Reviewed  POCT RAPID STREP A (OFFICE)    EKG   Radiology No results found.  Procedures Procedures (including critical care time)  Medications Ordered in UC Medications - No data to display  Initial Impression / Assessment and Plan / UC Course  I have reviewed the triage vital signs and the nursing notes.  Pertinent labs & imaging results that were available during my care of the patient were reviewed by me and considered in my medical decision making (see chart for details).     Acute otitis media of the right ear and sinusitis Covering for both with amoxicillin.  Antibiotics as prescribed.  Over-the-counter medicines as needed.  Recommended Flonase nasal spray for congestion and ibuprofen or Tylenol for pain as needed. Follow up as needed for continued or worsening symptoms  Final Clinical Impressions(s) / UC Diagnoses   Final diagnoses:  Acute otitis media, unspecified otitis media type     Discharge Instructions     Treated for a sinus and bilateral ear infection.  Take the medication as prescribed. You can continue over-the-counter medicines as needed.  Recommend Flonase nasal spray for congestion.  Ibuprofen or Tylenol for pain as needed  ED Prescriptions     Medication Sig Dispense Auth. Provider   amoxicillin-clavulanate (AUGMENTIN) 875-125 MG tablet Take 1 tablet by mouth every 12 (twelve) hours. 14 tablet Mida Cory A, NP     PDMP not reviewed this encounter.   Janace Aris, NP 02/06/20 1215

## 2020-02-12 ENCOUNTER — Other Ambulatory Visit: Payer: Self-pay | Admitting: Internal Medicine

## 2020-04-06 ENCOUNTER — Other Ambulatory Visit: Payer: Self-pay

## 2020-04-06 ENCOUNTER — Encounter: Payer: Self-pay | Admitting: Obstetrics and Gynecology

## 2020-04-06 ENCOUNTER — Other Ambulatory Visit (HOSPITAL_COMMUNITY)
Admission: RE | Admit: 2020-04-06 | Discharge: 2020-04-06 | Disposition: A | Discharge status: Home / Self Care | Payer: 59 | Source: Ambulatory Visit | Attending: Obstetrics and Gynecology | Admitting: Obstetrics and Gynecology

## 2020-04-06 ENCOUNTER — Ambulatory Visit (INDEPENDENT_AMBULATORY_CARE_PROVIDER_SITE_OTHER): Payer: 59 | Admitting: Obstetrics and Gynecology

## 2020-04-06 VITALS — BP 118/74 | Ht 59.0 in | Wt 151.0 lb

## 2020-04-06 DIAGNOSIS — Z113 Encounter for screening for infections with a predominantly sexual mode of transmission: Secondary | ICD-10-CM | POA: Diagnosis not present

## 2020-04-06 DIAGNOSIS — R102 Pelvic and perineal pain unspecified side: Secondary | ICD-10-CM

## 2020-04-06 NOTE — Addendum Note (Signed)
Addended by: [name] D on: 04/06/2020 03:10 PM   Modules accepted: Orders

## 2020-04-06 NOTE — Progress Notes (Signed)
Obstetrics & Gynecology Office Visit   Chief Complaint  Patient presents with  . Pelvic Pain    History of Present Illness: 25 y.o. G0P0000 female who was taking birth control and stopped taking due to the fact that she felt like she gained some weight. The first month she just had spotting.  The second month she had heavy bleeding and pain in both lower abdominal quadrants.  She continues to have pain in her lower quadrants. She also has breast tenderness.  She believes she gained about 20 pounds since 2020.   The pain in her lower quadrants is describes the pain as sharp. She rates the pain as 6/10 when present, as the pain comes and goes. Alleviating factors: none.  Aggravating factors: none noticed.  Associated symptoms: none.  The pain started a couple of days ago. The pain is unchanged over the past couple of days.  She denies fevers, chills, nausea, vomiting, hematuria, dysuria, hematochezia, melena, dyschezia.  She denies abnormal vaginal discharge, odor, itching, burning, irritation.   Prior to her taking combined OCPs she had never taken birth control.      Past Medical History:  Diagnosis Date  . ADHD   . Anxiety   . Costochondritis   . Hx of migraine headaches    common migraines    Past Surgical History:  Procedure Laterality Date  . MYRINGOTOMY    . WISDOM TOOTH EXTRACTION      Gynecologic History: Patient's last menstrual period was 03/31/2020.  Obstetric History: G0P0000  Family History  Problem Relation Age of Onset  . Pancreatic cancer Maternal Grandmother   . Lung cancer Maternal Grandfather   . Ovarian cancer Paternal Grandmother     Social History   Socioeconomic History  . Marital status: Single    Spouse name: Not on file  . Number of children: 0  . Years of education: Not on file  . Highest education level: Not on file  Occupational History  . 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 Smoking status: Former Smoker    Packs/day: 0.25     Types: Cigarettes    Start date: 04/08/2019  . Smokeless tobacco: Never Used  Vaping Use  . Vaping Use: Never used  Substance and Sexual Activity  . Alcohol use: Yes    Comment: Social   . Drug use: No  . Sexual activity: Not Currently    Birth control/protection: Pill  Other Topics Concern  . Not on file  Social History Narrative  . Not on file   Social Determinants of Health   Financial Resource Strain: Not on file  Food Insecurity: Not on file  Transportation Needs: Not on file  Physical Activity: Not on file  Stress: Not on file  Social Connections: Not on file  Intimate Partner Violence: Not on file   Allergies: No Known Allergies  Prior to Admission medications: Denies    Review of Systems  Constitutional: Negative.   HENT: Negative.   Eyes: Negative.   Respiratory: Negative.   Cardiovascular: Negative.   Gastrointestinal: Positive for abdominal pain (see HPI). Negative for blood in stool, constipation, diarrhea, melena, nausea and vomiting.  Genitourinary: Negative.   Musculoskeletal: Negative.   Skin: Negative.   Neurological: Negative.   Psychiatric/Behavioral: Negative.      Physical Exam BP 118/74   Ht 4\' 11"  (1.499 m)   Wt 151 lb (68.5 kg)   LMP 03/31/2020   BMI 30.50 kg/m  Patient's last menstrual period was  03/31/2020. Physical Exam Constitutional:      General: She is not in acute distress.    Appearance: Normal appearance. She is well-developed.  Genitourinary:     Vulva, bladder and urethral meatus normal.     No lesions in the vagina.     Right Labia: No rash, tenderness, lesions, skin changes or Bartholin's cyst.    Left Labia: No tenderness, skin changes, Bartholin's cyst or rash.    No inguinal adenopathy present in the right or left side.    Pelvic Tanner Score: 5/5.    No vaginal discharge, erythema, tenderness or bleeding.      Right Adnexa: tender.    Right Adnexa: not full and no mass present.    Left Adnexa: tender.     Left Adnexa: not full and no mass present.    Adnexa exam comments: Bimanual limited by patient discomfort.     No cervical motion tenderness, friability, lesion or polyp.     Uterus is not enlarged, fixed or tender.     Uterus is anteverted.     No urethral tenderness or mass present.     Pelvic exam was performed with patient in the lithotomy position.  HENT:     Head: Normocephalic and atraumatic.  Eyes:     General: No scleral icterus.    Conjunctiva/sclera: Conjunctivae normal.  Cardiovascular:     Rate and Rhythm: Normal rate and regular rhythm.     Heart sounds: No murmur heard. No friction rub. No gallop.   Pulmonary:     Effort: Pulmonary effort is normal. No respiratory distress.     Breath sounds: Normal breath sounds. No wheezing or rales.  Abdominal:     General: Bowel sounds are normal. There is no distension.     Palpations: Abdomen is soft. There is no mass.     Tenderness: There is abdominal tenderness. There is no guarding or rebound.     Hernia: There is no hernia in the left inguinal area or right inguinal area.  Musculoskeletal:        General: Normal range of motion.     Cervical back: Normal range of motion and neck supple.  Lymphadenopathy:     Lower Body: No right inguinal adenopathy. No left inguinal adenopathy.  Neurological:     General: No focal deficit present.     Mental Status: She is alert and oriented to person, place, and time.     Cranial Nerves: No cranial nerve deficit.  Skin:    General: Skin is warm and dry.     Findings: No erythema.  Psychiatric:        Mood and Affect: Mood normal.        Behavior: Behavior normal.        Judgment: Judgment normal.   Female chaperone present for pelvic and breast  portions of the physical exam  Wet Prep: PH: 4.5 Clue Cells: Negative Fungal elements: Negative Trichomonas: Negative   Assessment: 25 y.o. G0P0000 female here for  1. Acute pelvic pain   2. Screen for STD (sexually transmitted  disease)      Plan: Problem List Items Addressed This Visit   None   Visit Diagnoses    Acute pelvic pain    -  Primary   Relevant Orders   Cervicovaginal ancillary only   US PELVIS (TRANSABDOMINAL ONLY)   Screen for STD (sexually transmitted disease)       Relevant Orders   Cervicovaginal ancillary only  Discussed typical etiologies of pelvic pain.  She is quite tender and her RLQ is more tender than her LLQ.  We discussed anatomic lesions such as ovarian cysts (especially ruptured ones).  We discussed pelvic inflammatory disease and the low threshold to treat for this just in case.  We also discussed non-gynecologic causes like appendicitis. She was given precautions regarding worsening pain, especially (but not necessarily) associated with nausea, vomiting, fever, chills, anorexia.   Will obtain pelvic ultrasound as soon as we can get one.  A total of 25 minutes were spent face-to-face with the patient as well as preparation, review, communication, and documentation during this encounter.    [name], MD 04/06/2020 1:08 PM

## 2020-04-07 ENCOUNTER — Ambulatory Visit: Admission: RE | Admit: 2020-04-07 | Payer: 59 | Source: Ambulatory Visit

## 2020-04-07 ENCOUNTER — Ambulatory Visit
Admission: RE | Admit: 2020-04-07 | Discharge: 2020-04-07 | Disposition: A | Discharge status: Home / Self Care | Payer: 59 | Source: Ambulatory Visit | Attending: Obstetrics and Gynecology | Admitting: Obstetrics and Gynecology

## 2020-04-07 ENCOUNTER — Other Ambulatory Visit: Payer: Self-pay | Admitting: Obstetrics and Gynecology

## 2020-04-07 DIAGNOSIS — N83201 Unspecified ovarian cyst, right side: Secondary | ICD-10-CM | POA: Diagnosis not present

## 2020-04-07 DIAGNOSIS — R102 Pelvic and perineal pain: Secondary | ICD-10-CM

## 2020-04-08 LAB — CERVICOVAGINAL ANCILLARY ONLY
Chlamydia: NEGATIVE
Comment: NEGATIVE
Comment: NEGATIVE
Comment: NORMAL
Neisseria Gonorrhea: NEGATIVE
Trichomonas: NEGATIVE

## 2020-04-09 ENCOUNTER — Ambulatory Visit
Admission: RE | Admit: 2020-04-09 | Discharge: 2020-04-09 | Disposition: A | Discharge status: Home / Self Care | Payer: 59 | Source: Ambulatory Visit | Attending: Obstetrics and Gynecology | Admitting: Obstetrics and Gynecology

## 2020-04-09 ENCOUNTER — Telehealth: Payer: Self-pay | Admitting: Obstetrics and Gynecology

## 2020-04-09 ENCOUNTER — Other Ambulatory Visit: Payer: Self-pay

## 2020-04-09 ENCOUNTER — Other Ambulatory Visit: Payer: Self-pay | Admitting: Obstetrics and Gynecology

## 2020-04-09 DIAGNOSIS — N730 Acute parametritis and pelvic cellulitis: Secondary | ICD-10-CM

## 2020-04-09 DIAGNOSIS — R1031 Right lower quadrant pain: Secondary | ICD-10-CM

## 2020-04-09 MED ORDER — FLUCONAZOLE 150 MG PO TABS: 150.0000 mg | ORAL_TABLET | Freq: Once | ORAL | 0 refills | Status: DC

## 2020-04-09 MED ORDER — DOXYCYCLINE HYCLATE 100 MG PO CAPS: 100.0000 mg | ORAL_CAPSULE | Freq: Two times a day (BID) | ORAL | 0 refills | Status: DC

## 2020-04-09 MED ORDER — IOHEXOL 300 MG/ML  SOLN
100.0000 mL | Freq: Once | INTRAMUSCULAR | Status: AC | PRN
  Administered 2020-04-09: 100 mL via INTRAVENOUS

## 2020-04-09 MED ORDER — CEFTRIAXONE SODIUM 250 MG IJ SOLR
250.0000 mg | Freq: Once | INTRAMUSCULAR | Status: AC
  Administered 2020-04-10: 250 mg via INTRAMUSCULAR

## 2020-04-09 NOTE — Telephone Encounter (Signed)
Spoke w patient. She continues to have pain, especially on her right side. The pain comes and goes. She has had episodes of anorexia.  Her testing so far has been negative (negative STI, negative pelvic u/s). At this point, i'm concerned for either PID without obvious STI infection or appendicitis.   Will get her treated for PID and set up a CT abd/pelvis.  For PID will give ceftriaxone 250 mg IM and doxy 100 mg po bid x 14 days.    [name], MD, [facility], [facility] 04/09/2020 2:30 PM

## 2020-04-10 ENCOUNTER — Ambulatory Visit (INDEPENDENT_AMBULATORY_CARE_PROVIDER_SITE_OTHER): Payer: 59

## 2020-04-10 DIAGNOSIS — N730 Acute parametritis and pelvic cellulitis: Secondary | ICD-10-CM

## 2020-04-10 NOTE — Progress Notes (Signed)
Patient presents today for Rocephin injection. Given IM RUOQ. Patient tolerated well.

## 2020-04-29 ENCOUNTER — Ambulatory Visit: Payer: 59 | Admitting: Obstetrics and Gynecology

## 2020-04-29 ENCOUNTER — Other Ambulatory Visit: Payer: Self-pay

## 2020-04-29 ENCOUNTER — Encounter: Payer: Self-pay | Admitting: Obstetrics and Gynecology

## 2020-04-29 ENCOUNTER — Other Ambulatory Visit: Payer: Self-pay | Admitting: Obstetrics and Gynecology

## 2020-04-29 VITALS — BP 120/80 | Ht 59.0 in | Wt 155.0 lb

## 2020-04-29 DIAGNOSIS — N898 Other specified noninflammatory disorders of vagina: Secondary | ICD-10-CM

## 2020-04-29 LAB — POCT WET PREP WITH KOH
Clue Cells Wet Prep HPF POC: NEGATIVE
KOH Prep POC: NEGATIVE
Trichomonas, UA: NEGATIVE
Yeast Wet Prep HPF POC: NEGATIVE

## 2020-04-29 MED ORDER — FLUCONAZOLE 150 MG PO TABS: 150.0000 mg | ORAL_TABLET | Freq: Once | ORAL | 0 refills | Status: DC

## 2020-04-29 NOTE — Progress Notes (Signed)
Rachel Reichmann, MD   Chief Complaint  Patient presents with  . Vaginal Discharge    Itchiness, some irritation, no odor x few days    HPI:      [name] is a 25 y.o. G0P0000 whose LMP was Patient's last menstrual period was 03/31/2020 (exact date)., presents today for increased white vag d/c and irritation, no odor, for a few days. Has been on several abx for 2 wks to treat PID. Did diflucan last wk for 1 dose with some sx improvement, but since still on abx at the time, sx recurred this wk. Treated with monistat-1 a few days ago.   PID sx improved but still has some mild RLQ pain, particularly with sitting. Had neg GYN u/s and neg abd/pelvic CT scan 04/08/20. Neg STD testing 3/22.   Past Medical History:  Diagnosis Date  . ADHD   . Anxiety   . Costochondritis   . Hx of migraine headaches    common migraines    Past Surgical History:  Procedure Laterality Date  . MYRINGOTOMY    . WISDOM TOOTH EXTRACTION      Family History  Problem Relation Age of Onset  . Pancreatic cancer Maternal Grandmother   . Lung cancer Maternal Grandfather   . Ovarian cancer Paternal Grandmother     Social History   Socioeconomic History  . Marital status: Single    Spouse name: Not on file  . Number of children: 0  . Years of education: Not on file  . Highest education level: Not on file  Occupational History  . 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 Smoking status: Former Smoker    Packs/day: 0.25    Types: Cigarettes    Start date: 04/08/2019  . Smokeless tobacco: Never Used  Vaping Use  . Vaping Use: Never used  Substance and Sexual Activity  . Alcohol use: Yes    Comment: Social   . Drug use: No  . Sexual activity: Not Currently    Birth control/protection: None  Other Topics Concern  . Not on file  Social History Narrative  . Not on file   Social Determinants of Health   Financial Resource Strain: Not on file  Food Insecurity: Not on file   Transportation Needs: Not on file  Physical Activity: Not on file  Stress: Not on file  Social Connections: Not on file  Intimate Partner Violence: Not on file    Outpatient Medications Prior to Visit  Medication Sig Dispense Refill  . ergocalciferol (VITAMIN D2) 1.25 MG (50000 UT) capsule Take 1 capsule by mouth once a week.    . vitamin B-12 (CYANOCOBALAMIN) 1000 MCG tablet Take 1,000 mcg by mouth daily.    Marland Kitchen buPROPion (WELLBUTRIN XL) 300 MG 24 hr tablet TAKE 1 TABLET (300 MG TOTAL) BY MOUTH DAILY. (Patient not taking: Reported on 04/06/2020) 30 tablet 2  . ergocalciferol (VITAMIN D2) 1.25 MG (50000 UT) capsule Take by mouth. (Patient not taking: Reported on 04/06/2020)    . guaiFENesin (MUCINEX) 600 MG 12 hr tablet Take 2 tablets (1,200 mg total) by mouth 2 (two) times daily as needed. (Patient not taking: Reported on 04/06/2020) 12 tablet 0  . ibuprofen (ADVIL) 600 MG tablet Take 1 tablet (600 mg total) by mouth every 6 (six) hours as needed. (Patient not taking: Reported on 04/06/2020) 30 tablet 0  . Levonorgestrel-Ethinyl Estradiol (SIMPESSE) 0.15-0.03 &0.01 MG tablet Take 1 tablet by mouth daily. (Patient not taking:  Reported on 04/06/2020) 91 tablet 2  . omeprazole (PRILOSEC) 20 MG capsule Take 20 mg by mouth as needed.      No facility-administered medications prior to visit.      ROS:  Review of Systems  Constitutional: Negative for fever.  Gastrointestinal: Negative for blood in stool, constipation, diarrhea, nausea and vomiting.  Genitourinary: Positive for vaginal discharge. Negative for dyspareunia, dysuria, flank pain, frequency, hematuria, urgency, vaginal bleeding and vaginal pain.  Musculoskeletal: Negative for back pain.  Skin: Negative for rash.   BREAST: No symptoms   OBJECTIVE:   Vitals:  BP 120/80   Ht 4\' 11"  (1.499 m)   Wt 155 lb (70.3 kg)   LMP 03/31/2020 (Exact Date)   BMI 31.31 kg/m   Physical Exam Vitals reviewed.  Constitutional:       Appearance: She is well-developed.  Pulmonary:     Effort: Pulmonary effort is normal.  Genitourinary:    General: Normal vulva.     Pubic Area: No rash.      Labia:        Right: No rash, tenderness or lesion.        Left: No rash, tenderness or lesion.      Vagina: Normal. No vaginal discharge, erythema or tenderness.     Cervix: Normal.     Uterus: Normal. Not enlarged and not tender.      Adnexa: Right adnexa normal and left adnexa normal.       Right: No mass or tenderness.         Left: No mass or tenderness.       Comments: VAGINA TENDER WITH DIGITAL EXAM Musculoskeletal:        General: Normal range of motion.     Cervical back: Normal range of motion.  Skin:    General: Skin is warm and dry.  Neurological:     General: No focal deficit present.     Mental Status: She is alert and oriented to person, place, and time.  Psychiatric:        Mood and Affect: Mood normal.        Behavior: Behavior normal.        Thought Content: Thought content normal.        Judgment: Judgment normal.     Results: Results for orders placed or performed in visit on 04/29/20 (from the past 24 hour(s))  POCT Wet Prep with KOH     Status: Normal   Collection Time: 04/29/20  4:04 PM  Result Value Ref Range   Trichomonas, UA Negative    Clue Cells Wet Prep HPF POC neg    Epithelial Wet Prep HPF POC     Yeast Wet Prep HPF POC neg    Bacteria Wet Prep HPF POC     RBC Wet Prep HPF POC     WBC Wet Prep HPF POC     KOH Prep POC Negative Negative     Assessment/Plan: Vaginal itching - Plan: fluconazole (DIFLUCAN) 150 MG tablet, POCT Wet Prep with KOH; neg exam/wet prep. Pt treated with monistat, so most likely working. Rx diflucan, OTC hydrocortisone crm ext prn itch. F/u prn.    Meds ordered this encounter  Medications  . fluconazole (DIFLUCAN) 150 MG tablet    Sig: Take 1 tablet (150 mg total) by mouth once for 1 dose.    Dispense:  1 tablet    Refill:  0    Order Specific  Question:   Supervising Provider  Answer:   Nadara Mustard [[id number]]      Return if symptoms worsen or fail to improve.  Chandler Swiderski B. Orene Abbasi, PA-C 04/29/2020 4:05 PM

## 2020-04-29 NOTE — Patient Instructions (Signed)
I value your feedback and you entrusting us with your care. If you get a [facility] patient survey, I would appreciate you taking the time to let us know about your experience today. Thank you! ? ? ?

## 2020-07-14 DIAGNOSIS — R739 Hyperglycemia, unspecified: Secondary | ICD-10-CM | POA: Diagnosis not present

## 2020-07-14 DIAGNOSIS — F909 Attention-deficit hyperactivity disorder, unspecified type: Secondary | ICD-10-CM | POA: Diagnosis not present

## 2020-07-14 DIAGNOSIS — F419 Anxiety disorder, unspecified: Secondary | ICD-10-CM | POA: Diagnosis not present

## 2020-07-14 DIAGNOSIS — M722 Plantar fascial fibromatosis: Secondary | ICD-10-CM | POA: Diagnosis not present

## 2020-07-14 DIAGNOSIS — R7989 Other specified abnormal findings of blood chemistry: Secondary | ICD-10-CM | POA: Diagnosis not present

## 2020-07-14 DIAGNOSIS — E538 Deficiency of other specified B group vitamins: Secondary | ICD-10-CM | POA: Diagnosis not present

## 2020-07-21 DIAGNOSIS — Z23 Encounter for immunization: Secondary | ICD-10-CM | POA: Diagnosis not present

## 2020-07-21 DIAGNOSIS — B360 Pityriasis versicolor: Secondary | ICD-10-CM | POA: Diagnosis not present

## 2020-07-21 DIAGNOSIS — Z6831 Body mass index (BMI) 31.0-31.9, adult: Secondary | ICD-10-CM | POA: Diagnosis not present

## 2020-07-21 DIAGNOSIS — M722 Plantar fascial fibromatosis: Secondary | ICD-10-CM | POA: Diagnosis not present

## 2020-07-24 ENCOUNTER — Ambulatory Visit: Payer: 59 | Admitting: Obstetrics and Gynecology

## 2020-08-03 ENCOUNTER — Other Ambulatory Visit: Payer: Self-pay

## 2020-08-03 ENCOUNTER — Encounter: Payer: Self-pay | Admitting: Obstetrics and Gynecology

## 2020-08-03 ENCOUNTER — Ambulatory Visit (INDEPENDENT_AMBULATORY_CARE_PROVIDER_SITE_OTHER): Payer: 59 | Admitting: Obstetrics and Gynecology

## 2020-08-03 VITALS — BP 108/70 | Ht 59.0 in | Wt 154.8 lb

## 2020-08-03 DIAGNOSIS — N926 Irregular menstruation, unspecified: Secondary | ICD-10-CM

## 2020-08-03 DIAGNOSIS — N914 Secondary oligomenorrhea: Secondary | ICD-10-CM | POA: Diagnosis not present

## 2020-08-03 DIAGNOSIS — R102 Pelvic and perineal pain: Secondary | ICD-10-CM | POA: Diagnosis not present

## 2020-08-03 DIAGNOSIS — G8929 Other chronic pain: Secondary | ICD-10-CM | POA: Diagnosis not present

## 2020-08-03 DIAGNOSIS — Z1331 Encounter for screening for depression: Secondary | ICD-10-CM

## 2020-08-03 DIAGNOSIS — Z01419 Encounter for gynecological examination (general) (routine) without abnormal findings: Secondary | ICD-10-CM | POA: Diagnosis not present

## 2020-08-03 DIAGNOSIS — Z1339 Encounter for screening examination for other mental health and behavioral disorders: Secondary | ICD-10-CM

## 2020-08-03 LAB — POCT URINE PREGNANCY: Preg Test, Ur: NEGATIVE

## 2020-08-03 NOTE — Progress Notes (Signed)
Gynecology Annual Exam  PCP: Barbette Reichmann, MD  Chief Complaint  Patient presents with   Gynecologic Exam   History of Present Illness:  [name] is a 25 y.o. G0P0000 who LMP was Patient's last menstrual period was 06/05/2020., presents today for her annual examination.  Her menses are irregular. She didn't have a period in June. Her LMP was early May.  She is no longer taking combined OCPs.    The pain she had before that was bilateral is now in her mid pelvic-floor.  This is present a few times a week.    She is not sexually active.  Last Pap: 07/2019  Results were: no abnormalities /neg HPV DNA not done due to age Hx of STDs: none  There is no FH of breast cancer. There is a FH of ovarian cancer definitely in her paternal side in her grandmother and possibly in her maternal grandmother. The patient does occasionally do self-breast exams.  Tobacco use: The patient denies current or previous tobacco use. Alcohol use: social drinker Exercise: a lot.  Nearly every day.   The patient wears seatbelts: yes.   The patient reports that domestic violence in her life is absent.   Past Medical History:  Diagnosis Date   ADHD    Anxiety    Costochondritis    Hx of migraine headaches    common migraines    Past Surgical History:  Procedure Laterality Date   MYRINGOTOMY     WISDOM TOOTH EXTRACTION      Prior to Admission medications   Medication Sig Start Date End Date Taking? Authorizing Provider  amphetamine-dextroamphetamine (ADDERALL XR) 10 MG 24 hr capsule TAKE 1 CAPSULE BY MOUTH EVERY MORNING 01/07/20 07/05/20  Hande, Roderic Palau, MD  amphetamine-dextroamphetamine (ADDERALL XR) 10 MG 24 hr capsule TAKE 1 CAPSULE BY MOUTH EVERY MORNING FOR 30 DAYS 11/21/19 05/19/20  Barbette Reichmann, MD  Levonorgestrel-Ethinyl Estradiol (SIMPESSE) 0.15-0.03 &0.01 MG tablet Take 1 tablet by mouth daily. 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  Tobacco Use   Smoking status: Former    Packs/day: 0.25    Pack years: 0.00    Types: Cigarettes    Start date: 04/08/2019   Smokeless tobacco: Never  Vaping Use   Vaping Use: Never used  Substance and Sexual Activity   Alcohol use: Yes    Comment: Social    Drug use: No   Sexual activity: Not Currently    Birth control/protection: None  Other Topics Concern   Not on file  Social History Narrative   Not on file   Social Determinants of Health   Financial Resource Strain: Not on file  Food Insecurity: Not on file  Transportation Needs: Not on file  Physical Activity: Not on file  Stress: Not on file  Social Connections: Not on file  Intimate Partner Violence: Not on file    Family History  Problem Relation Age of Onset   Pancreatic cancer Maternal Grandmother    Lung cancer Maternal Grandfather    Ovarian cancer Paternal Grandmother     Review of Systems  Constitutional: Negative.  HENT: Negative.    Eyes: Negative.   Respiratory: Negative.    Cardiovascular: Negative.   Gastrointestinal: Negative.   Genitourinary: Negative.   Musculoskeletal: Negative.   Skin: Negative.   Neurological: Negative.   Psychiatric/Behavioral: Negative.      Physical Exam BP 108/70   Ht 4\' 11"  (1.499 m)   Wt 154 lb 12.8 oz (70.2 kg)   LMP 06/05/2020   BMI 31.27 kg/m    Physical Exam Constitutional:      General: She is not in acute distress.    Appearance: Normal appearance. She is well-developed.  Genitourinary:  Breasts:    Tanner Score is 5.     Right: No swelling, bleeding, inverted nipple, mass, nipple discharge, skin change, tenderness, axillary  adenopathy or supraclavicular adenopathy.     Left: No swelling, bleeding, inverted nipple, mass, nipple discharge, skin change, tenderness, axillary adenopathy or supraclavicular adenopathy.  HENT:     Head: Normocephalic and atraumatic.  Eyes:     General: No scleral icterus.    Conjunctiva/sclera: Conjunctivae normal.  Cardiovascular:     Rate and Rhythm: Normal rate and regular rhythm.     Heart sounds: No murmur heard.   No friction rub. No gallop.  Pulmonary:     Effort: Pulmonary effort is normal. No respiratory distress.     Breath sounds: Normal breath sounds. No wheezing or rales.  Abdominal:     General: Bowel sounds are normal. There is no distension.     Palpations: Abdomen is soft. There is no mass.     Tenderness: There is no abdominal tenderness. There is no guarding or rebound.  Musculoskeletal:        General: Normal range of motion.     Cervical back: Normal range of motion and neck supple.  Lymphadenopathy:     Upper Body:     Right upper body: No supraclavicular or axillary adenopathy.     Left upper body: No supraclavicular or axillary adenopathy.  Neurological:     General: No focal deficit present.     Mental Status: She is alert and oriented to person, place, and time.     Cranial Nerves: No cranial nerve deficit.  Skin:    General: Skin is warm and dry.     Findings: No erythema.  Psychiatric:        Mood and Affect: Mood normal.        Behavior: Behavior normal.        Judgment: Judgment normal.    Female chaperone present for pelvic and breast  portions of the physical exam  Results: AUDIT Questionnaire (screen for alcoholism): 4 PHQ-9: 5  Assessment: 25 y.o. G0P0000 female here for routine annual gynecologic examination  Plan: Problem List Items Addressed This Visit   None Visit Diagnoses     Women's annual routine gynecological examination    -  Primary   Missed period       Relevant Orders   POCT urine pregnancy (Completed)    Screening for depression       Screening for alcoholism       Secondary oligomenorrhea       Relevant Orders   17-Hydroxyprogesterone   Prolactin   TSH   Testosterone,Free and Total   Hgb A1c w/o eAG   FSH/LH   DHEA-sulfate       Screening: -- Blood pressure screen normal -- Weight screening: obese: discussed management options, including lifestyle, dietary, and exercise. -- Depression screening negative (PHQ-9) --  Nutrition: normal -- cholesterol screening: not due for screening -- osteoporosis screening: not due -- tobacco screening: not using -- alcohol screening: AUDIT questionnaire indicates low-risk usage. -- family history of breast cancer screening: done. not at high risk. -- no evidence of domestic violence or intimate partner violence. -- STD screening: gonorrhea/chlamydia NAAT not collected per patient request. -- pap smear not collected per ASCCP guidelines -- HPV vaccination series:  unsure. Will check.   Labs for PCOS, given androgen signs, infrequent periods, and imaging findings consistent with PCOS.  Pelvic floor PT for chronic pelvic pain. Discussed surgery for endometriosis vs treatment with medication. She would like to try PT first.    [name], MD 08/03/2020 2:06 PM

## 2020-08-07 LAB — 17-HYDROXYPROGESTERONE: 17-Hydroxyprogesterone: 67 ng/dL

## 2020-08-07 LAB — TESTOSTERONE,FREE AND TOTAL
Testosterone, Free: 4.6 pg/mL — ABNORMAL HIGH (ref 0.0–4.2)
Testosterone: 63 ng/dL (ref 13–71)

## 2020-08-07 LAB — DHEA-SULFATE: DHEA-SO4: 296 ug/dL (ref 110.0–431.7)

## 2020-08-07 LAB — PROLACTIN: Prolactin: 22.8 ng/mL (ref 4.8–23.3)

## 2020-08-07 LAB — TSH: TSH: 1.54 u[IU]/mL (ref 0.450–4.500)

## 2020-08-07 LAB — FSH/LH
FSH: 4.3 m[IU]/mL
LH: 16.6 m[IU]/mL

## 2020-08-07 LAB — HGB A1C W/O EAG: Hgb A1c MFr Bld: 5.4 % (ref 4.8–5.6)

## 2020-09-02 ENCOUNTER — Encounter: Payer: Self-pay | Admitting: Obstetrics and Gynecology

## 2020-09-03 ENCOUNTER — Telehealth: Payer: Self-pay | Admitting: Obstetrics and Gynecology

## 2020-09-03 NOTE — Telephone Encounter (Signed)
Called pt and left message about rescheduling the 8/23 appt with SDJ.  Asked pt to call the office back to reschedule.

## 2020-09-08 ENCOUNTER — Telehealth: Payer: Self-pay | Admitting: Obstetrics and Gynecology

## 2020-09-08 ENCOUNTER — Other Ambulatory Visit: Payer: Self-pay | Admitting: Obstetrics and Gynecology

## 2020-09-08 ENCOUNTER — Encounter: Payer: Self-pay | Admitting: Obstetrics and Gynecology

## 2020-09-08 ENCOUNTER — Ambulatory Visit: Payer: 59 | Admitting: Obstetrics and Gynecology

## 2020-09-08 ENCOUNTER — Other Ambulatory Visit: Payer: Self-pay

## 2020-09-08 ENCOUNTER — Other Ambulatory Visit (HOSPITAL_COMMUNITY)
Admission: RE | Admit: 2020-09-08 | Discharge: 2020-09-08 | Disposition: A | Discharge status: Home / Self Care | Payer: 59 | Source: Ambulatory Visit | Attending: Obstetrics and Gynecology | Admitting: Obstetrics and Gynecology

## 2020-09-08 VITALS — BP 120/78 | HR 90 | Ht 59.0 in | Wt 152.0 lb

## 2020-09-08 DIAGNOSIS — Z202 Contact with and (suspected) exposure to infections with a predominantly sexual mode of transmission: Secondary | ICD-10-CM

## 2020-09-08 DIAGNOSIS — E282 Polycystic ovarian syndrome: Secondary | ICD-10-CM

## 2020-09-08 HISTORY — DX: Polycystic ovarian syndrome: E28.2

## 2020-09-08 MED ORDER — NORETHIN ACE-ETH ESTRAD-FE 1-20 MG-MCG PO TABS
1.0000 | ORAL_TABLET | Freq: Every day | ORAL | 4 refills | Status: AC
  Filled 2020-09-08: qty 84, 84d supply, fill #0
  Filled 2020-11-30: qty 84, 84d supply, fill #1
  Filled 2021-02-22: qty 84, 84d supply, fill #2

## 2020-09-08 NOTE — Telephone Encounter (Signed)
Discussed labs and ultrasound.  Findings consistent with PCOS.  Discussed risk of diabetes, hypercholesterolemia, abnormal periods, subfertility, endometrial cancer (in the extreme circumstance).   Will start with treatment of periods with combined OCPs.   Follow up as needed.

## 2020-09-08 NOTE — Progress Notes (Signed)
Obstetrics & Gynecology Office Visit   Chief Complaint  Patient presents with   Gynecologic Exam    STD/PCOS Check    History of Present Illness: 25 y.o. G0P0000 female who is concerned she might have had exposure to an STI. She would like to be tested. She denies any acute symptoms.     Past Medical History:  Diagnosis Date   ADHD    Anxiety    Costochondritis    Family history of ovarian cancer    Hx of migraine headaches    common migraines   PCOS (polycystic ovarian syndrome) 09/08/2020    Past Surgical History:  Procedure Laterality Date   MYRINGOTOMY     WISDOM TOOTH EXTRACTION      Gynecologic History: Patient's last menstrual period was 08/21/2020 (approximate).  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  Tobacco Use   Smoking status: Former    Packs/day: 0.25    Types: Cigarettes    Start date: 04/08/2019   Smokeless tobacco: Never  Vaping Use   Vaping Use: Never used  Substance and Sexual Activity   Alcohol use: Yes    Comment: Social    Drug use: No   Sexual activity: Not Currently    Birth control/protection: None  Other Topics Concern   Not on file  Social History Narrative   Not on file   Social Determinants of Health   Financial Resource Strain: Not on file  Food Insecurity: Not on file  Transportation Needs: Not on file  Physical Activity: Not on file  Stress: Not on file  Social Connections: Not on file  Intimate Partner Violence: Not on file    No Known Allergies  Prior to Admission medications   Medication Sig Start Date End  Date Taking? Authorizing Provider  amphetamine-dextroamphetamine (ADDERALL XR) 10 MG 24 hr capsule TAKE 1 CAPSULE BY MOUTH EVERY MORNING 01/07/20 07/05/20  Hande, Roderic Palau, MD  amphetamine-dextroamphetamine (ADDERALL XR) 10 MG 24 hr capsule TAKE 1 CAPSULE BY MOUTH EVERY MORNING FOR 30 DAYS 11/21/19 05/19/20  Barbette Reichmann, MD  norethindrone-ethinyl estradiol-FE (JUNEL FE 1/20) 1-20 MG-MCG tablet Take 1 tablet by mouth daily. Patient not taking: Reported on 09/08/2020 09/08/20   Conard Novak, MD    Review of Systems  Constitutional: Negative.   HENT: Negative.    Eyes: Negative.   Respiratory: Negative.    Cardiovascular: Negative.   Gastrointestinal: Negative.   Genitourinary: Negative.   Musculoskeletal: Negative.   Skin: Negative.   Neurological: Negative.   Psychiatric/Behavioral: Negative.      Physical Exam BP 120/78 (Cuff Size: Normal)   Pulse 90   Ht 4\' 11"  (1.499 m)   Wt 152 lb (68.9 kg)   LMP 08/21/2020 (Approximate)   BMI 30.70 kg/m  Patient's last menstrual period was 08/21/2020 (approximate). Physical Exam Constitutional:      General: She is not in acute distress.    Appearance: Normal appearance.  Genitourinary:     Right Labia: No rash, tenderness, lesions, skin changes or Bartholin's cyst.  Left Labia: No tenderness, lesions, skin changes, Bartholin's cyst or rash.    No labial fusion noted.     No inguinal adenopathy present in the right or left side.    Pelvic Tanner Score: 5/5.    No cervical motion tenderness.  HENT:     Head: Normocephalic and atraumatic.  Eyes:     General: No scleral icterus.    Conjunctiva/sclera: Conjunctivae normal.  Lymphadenopathy:     Lower Body: No right inguinal adenopathy. No left inguinal adenopathy.  Neurological:     General: No focal deficit present.     Mental Status: She is alert and oriented to person, place, and time.     Cranial Nerves: No cranial nerve deficit.  Psychiatric:        Mood and Affect:  Mood normal.        Behavior: Behavior normal.        Judgment: Judgment normal.    Female chaperone present for pelvic and breast  portions of the physical exam  Assessment: 25 y.o. G0P0000 female here for  1. Possible exposure to STD      Plan: Problem List Items Addressed This Visit   None Visit Diagnoses     Possible exposure to STD    -  Primary   Relevant Orders   Cervicovaginal ancillary only      Without symptoms and only requesting testing, will send STI test for gonorrhea, chlamydia, and trichomonas.  [name], MD 09/08/2020 1:40 PM

## 2020-09-10 LAB — CERVICOVAGINAL ANCILLARY ONLY
Chlamydia: NEGATIVE
Comment: NEGATIVE
Comment: NEGATIVE
Comment: NORMAL
Neisseria Gonorrhea: NEGATIVE
Trichomonas: NEGATIVE

## 2020-09-28 ENCOUNTER — Ambulatory Visit: Payer: 59 | Admitting: Obstetrics and Gynecology

## 2020-09-29 ENCOUNTER — Ambulatory Visit: Payer: 59 | Admitting: Obstetrics and Gynecology

## 2020-10-08 ENCOUNTER — Other Ambulatory Visit: Payer: Self-pay

## 2020-11-30 ENCOUNTER — Other Ambulatory Visit: Payer: Self-pay

## 2020-12-17 ENCOUNTER — Other Ambulatory Visit: Payer: Self-pay

## 2020-12-17 ENCOUNTER — Ambulatory Visit: Payer: 59 | Admitting: Obstetrics and Gynecology

## 2020-12-17 ENCOUNTER — Encounter: Payer: Self-pay | Admitting: Obstetrics and Gynecology

## 2020-12-17 VITALS — BP 137/93 | Ht 59.0 in | Wt 154.0 lb

## 2020-12-17 DIAGNOSIS — E6609 Other obesity due to excess calories: Secondary | ICD-10-CM

## 2020-12-17 DIAGNOSIS — E66811 Obesity, class 1: Secondary | ICD-10-CM

## 2020-12-17 DIAGNOSIS — Z6831 Body mass index (BMI) 31.0-31.9, adult: Secondary | ICD-10-CM

## 2020-12-17 MED ORDER — PHENTERMINE HCL 37.5 MG PO CAPS
37.5000 mg | ORAL_CAPSULE | ORAL | 0 refills | Status: AC
  Filled 2020-12-17: qty 90, 90d supply, fill #0

## 2020-12-17 NOTE — Progress Notes (Signed)
Virtual Visit via Telephone Note  I connected with Rachel Hebert on 12/17/20 at  4:10 PM EST by telephone and verified that I am speaking with the correct person using two identifiers.   I discussed the limitations, risks, security and privacy concerns of performing an evaluation and management service by telephone and the availability of in person appointments. I also discussed with the patient that there may be a patient responsible charge related to this service. The patient expressed understanding and agreed to proceed.  The patient was at home I spoke with the patient from my  office in [facility] The names of people involved in this encounter were: Rachel Hebert and Rachel Mohair, MD.   History of Present Illness: 25 y.o. G0P0000 female who presents to discuss weight loss.  She states that she has been eating better and working out.  She feels like she has been stuck in the 150-155 lb range.  For medication she is only taking combined OCPs.  She states that taking the combined OCPs has been a good thing.    She would like something to jump start her weight loss.     Observations/Objective: Her weight today is 154 lb.  Physical Exam could not be performed. Because of the COVID-19 outbreak this visit was performed over the phone and not in person.    Assessment and Plan:   ICD-10-CM   1. Class 1 obesity due to excess calories without serious comorbidity with body mass index (BMI) of 31.0 to 31.9 in adult  E66.09 phentermine 37.5 MG capsule   Z68.31        Follow Up Instructions: Her target weight is 120 lbs   Will start with phentermine. Discussed risks/benefits and limitations of this medication. She wishes to proceed.   I discussed the assessment and treatment plan with the patient. The patient was provided an opportunity to ask questions and all were answered. The patient agreed with the plan and demonstrated an understanding of the instructions.   The patient was  advised to call back or seek an in-person evaluation if the symptoms worsen or if the condition fails to improve as anticipated.  I provided 24 minutes of non-face-to-face time during this encounter.  Rachel Mohair, MD  [facility], [facility] 12/17/2020 5:29 PM

## 2020-12-18 ENCOUNTER — Other Ambulatory Visit: Payer: Self-pay

## 2021-02-22 ENCOUNTER — Other Ambulatory Visit: Payer: Self-pay

## 2021-05-10 ENCOUNTER — Other Ambulatory Visit: Payer: Self-pay

## 2021-05-10 DIAGNOSIS — Z1339 Encounter for screening examination for other mental health and behavioral disorders: Secondary | ICD-10-CM | POA: Diagnosis not present

## 2021-05-10 DIAGNOSIS — Z3041 Encounter for surveillance of contraceptive pills: Secondary | ICD-10-CM | POA: Diagnosis not present

## 2021-05-10 DIAGNOSIS — Z113 Encounter for screening for infections with a predominantly sexual mode of transmission: Secondary | ICD-10-CM | POA: Diagnosis not present

## 2021-05-10 DIAGNOSIS — Z01419 Encounter for gynecological examination (general) (routine) without abnormal findings: Secondary | ICD-10-CM | POA: Diagnosis not present

## 2021-05-10 DIAGNOSIS — Z1331 Encounter for screening for depression: Secondary | ICD-10-CM | POA: Diagnosis not present

## 2021-05-10 MED ORDER — NORETHIN ACE-ETH ESTRAD-FE 1-20 MG-MCG PO TABS
1.0000 | ORAL_TABLET | Freq: Every day | ORAL | 4 refills | Status: AC
  Filled 2021-05-10: qty 84, 84d supply, fill #0
  Filled 2021-06-25: qty 28, 28d supply, fill #3
  Filled 2021-06-25: qty 84, 84d supply, fill #1
  Filled 2021-06-25: qty 84, 84d supply, fill #2

## 2021-05-10 MED ORDER — PHENTERMINE HCL 37.5 MG PO TABS
ORAL_TABLET | ORAL | 0 refills | Status: AC
  Filled 2021-05-10: qty 30, 30d supply, fill #0

## 2021-06-07 ENCOUNTER — Other Ambulatory Visit: Payer: Self-pay

## 2021-06-10 IMAGING — CT CT ABD-PELV W/ CM
2 of 4 series · 16 of 46 positions shown, 18 images · IV contrast (omnipaque)
Comparison: None.

CLINICAL DATA: Patient states RLQ abdomen pain x 3 days.

EXAM:
CT ABDOMEN AND PELVIS WITH CONTRAST
TECHNIQUE: Multidetector CT imaging of the abdomen and pelvis was performed
using the standard protocol following bolus administration of
intravenous contrast.
CONTRAST:  100mL OMNIPAQUE IOHEXOL 300 MG/ML  SOLN

[Series 2: axial st · axial · 0.82mm/px · z∈[-816,-396]mm · 13 of 92 slices shown, 15 images]
[im 4/92  soft-tissue]
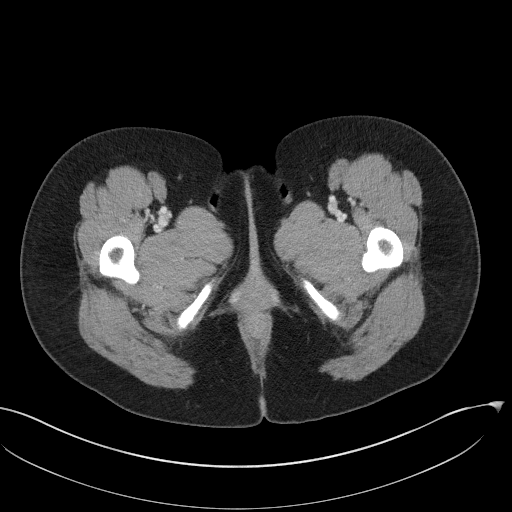
[im 4/92  bone]
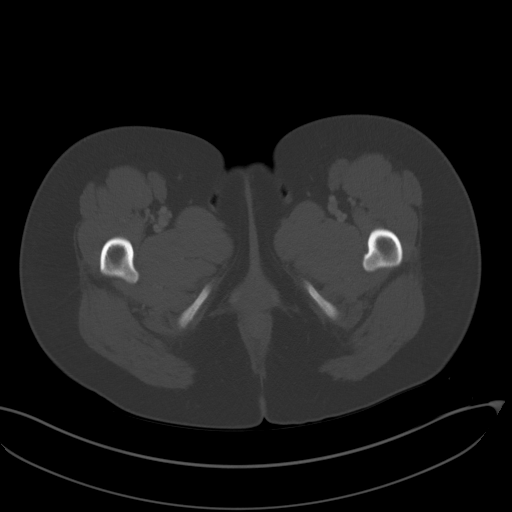
[im 12/92  soft-tissue]
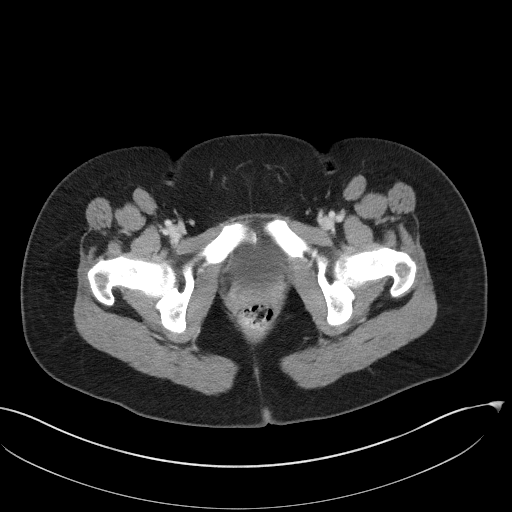
[im 19/92  soft-tissue]
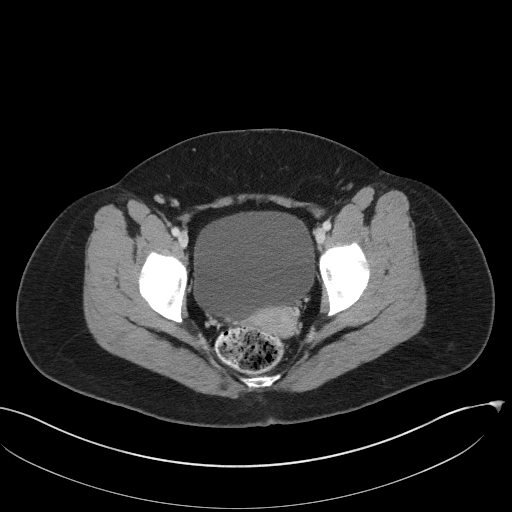
[im 27/92  soft-tissue]
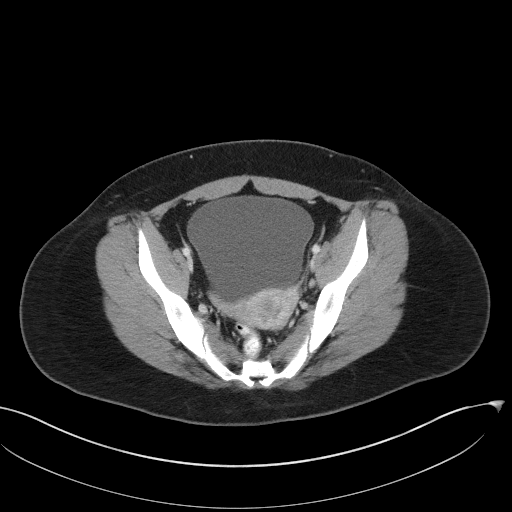
[im 31/92  soft-tissue]
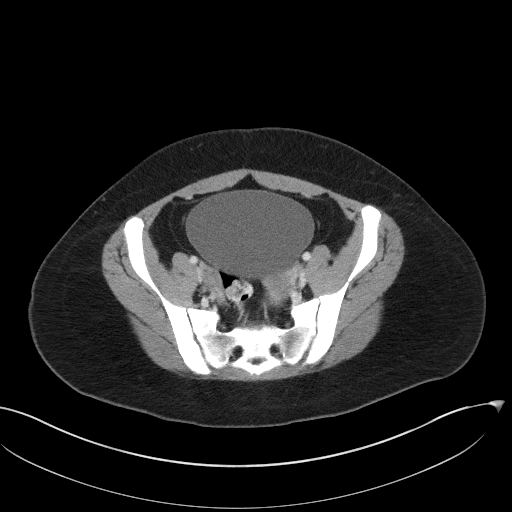
[im 38/92  soft-tissue]
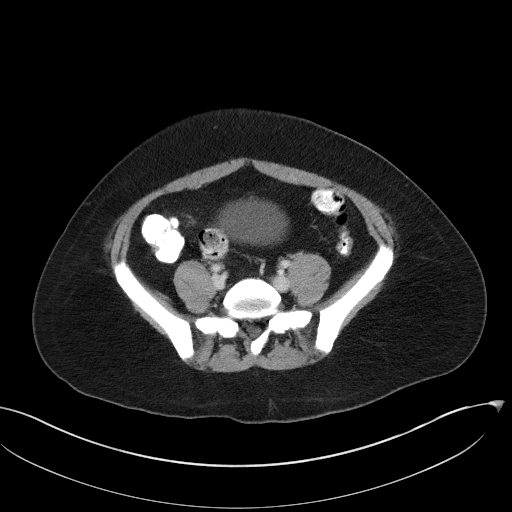
[im 46/92  soft-tissue]
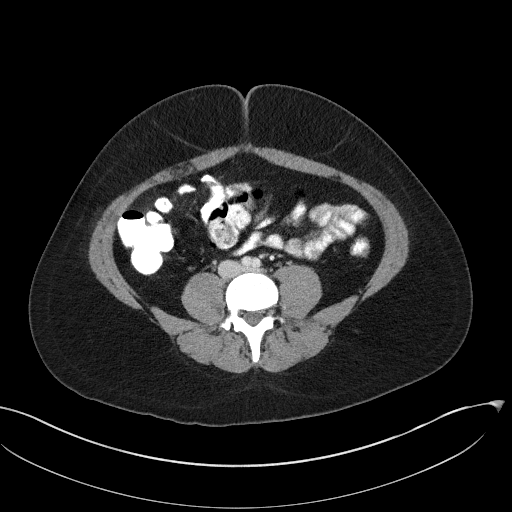
[im 54/92  soft-tissue]
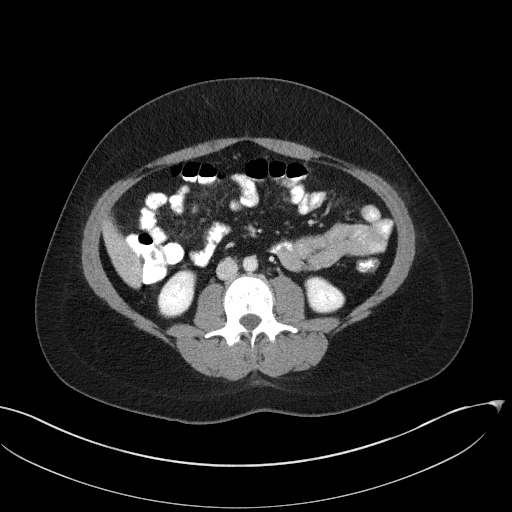
[im 61/92  soft-tissue]
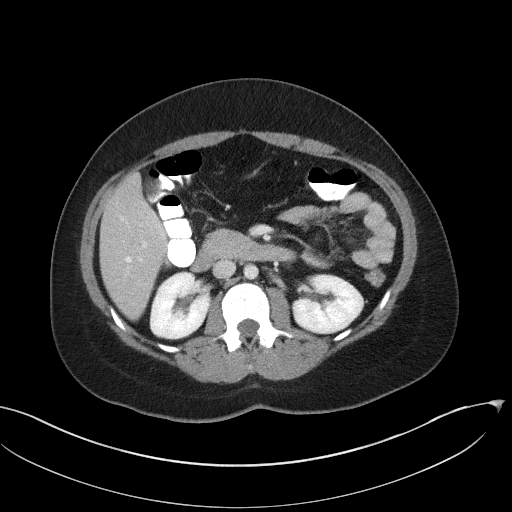
[im 61/92  bone]
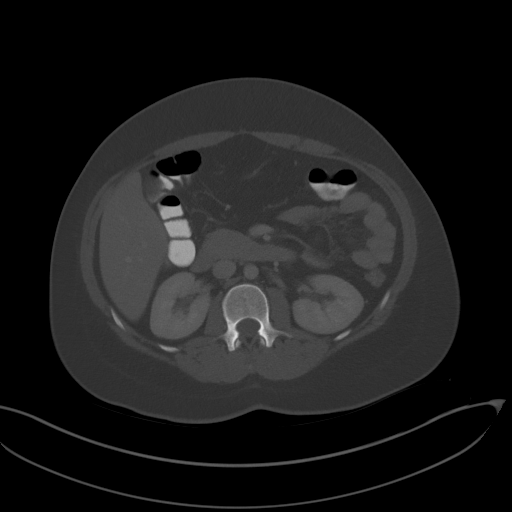
[im 65/92  soft-tissue]
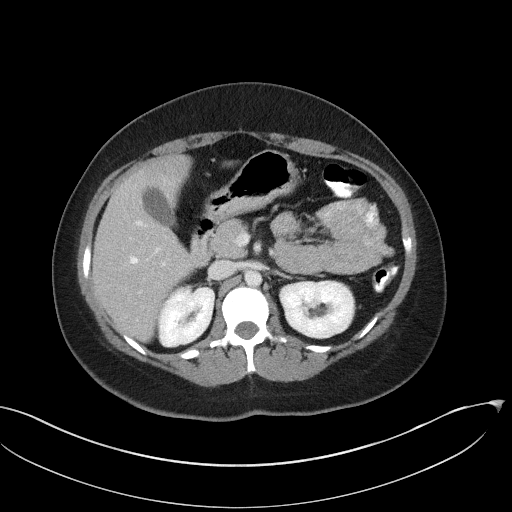
[im 73/92  soft-tissue]
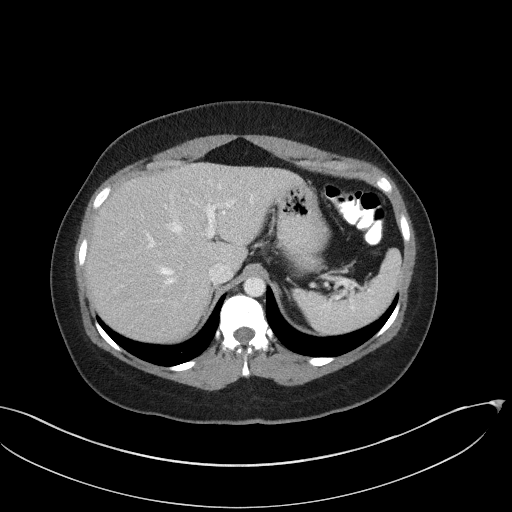
[im 80/92  soft-tissue]
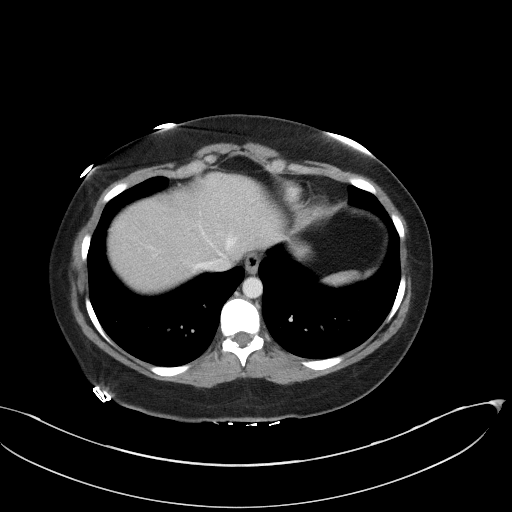
[im 88/92  soft-tissue]
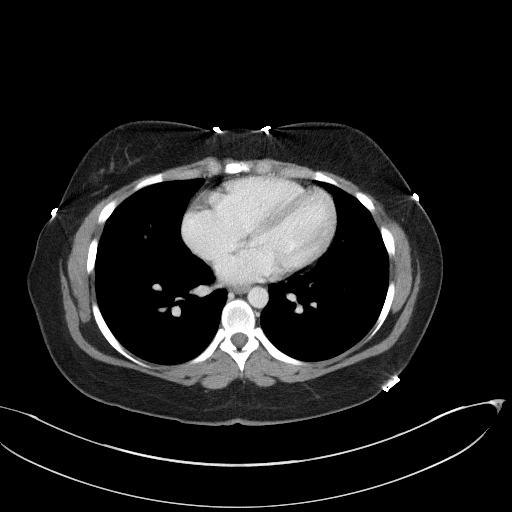

[Series 5: coronal st · coronal · 0.78mm/px · 3 of 89 slices shown]
[im 30/89  soft-tissue]
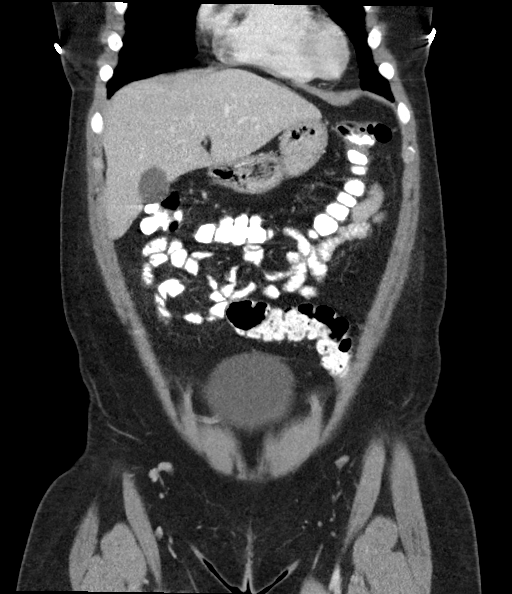
[im 40/89  soft-tissue]
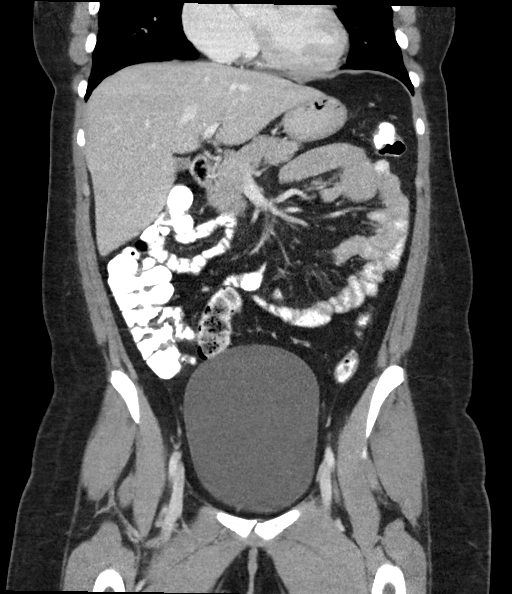
[im 49/89  soft-tissue]
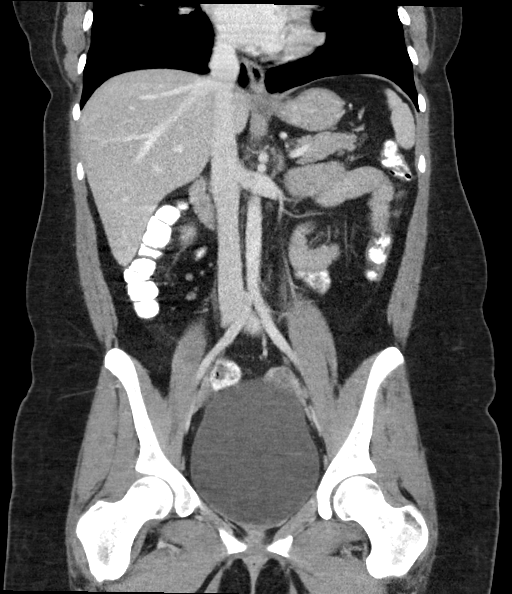

[16 of 46 positions shown; findings below may reference images not displayed]

FINDINGS: Lower chest: No acute abnormality.

Hepatobiliary: No focal liver abnormality is seen. No gallstones,
gallbladder wall thickening, or biliary dilatation.

Pancreas: Unremarkable. No pancreatic ductal dilatation or
surrounding inflammatory changes.

Spleen: Normal in size without focal abnormality.

Adrenals/Urinary Tract: Adrenal glands are unremarkable. Tiny
hypodensity in the superior pole the left kidney which is too small
to fully characterize but may represent a tiny cyst. No
hydronephrosis or renal calculi. No solid masses. Bladder is
unremarkable.

Stomach/Bowel: Stomach is within normal limits. Appendix appears
normal. No evidence of bowel wall thickening, distention, or
inflammatory changes.

Vascular/Lymphatic: No significant vascular findings are present. No
enlarged abdominal or pelvic lymph nodes.

Reproductive: Uterus and bilateral adnexa are unremarkable.

Other: No abdominal wall hernia or abnormality. No abdominopelvic
ascites.

Musculoskeletal: No acute or significant osseous findings.
IMPRESSION: No acute findings in the abdomen or pelvis. Normal appendix.

## 2021-06-25 ENCOUNTER — Other Ambulatory Visit: Payer: Self-pay

## 2021-12-06 ENCOUNTER — Encounter (INDEPENDENT_AMBULATORY_CARE_PROVIDER_SITE_OTHER): Payer: Self-pay
